# Patient Record
Sex: Female | Born: 1985 | Hispanic: Yes | Marital: Married | Smoking: Former smoker
Health system: Southern US, Community
[De-identification: ages and names within clinical notes are randomized; demographics above are authoritative.]

## PROBLEM LIST (undated history)

## (undated) DIAGNOSIS — E739 Lactose intolerance, unspecified: Secondary | ICD-10-CM

## (undated) DIAGNOSIS — R200 Anesthesia of skin: Secondary | ICD-10-CM

## (undated) DIAGNOSIS — M2550 Pain in unspecified joint: Secondary | ICD-10-CM

## (undated) DIAGNOSIS — M549 Dorsalgia, unspecified: Secondary | ICD-10-CM

## (undated) DIAGNOSIS — G4730 Sleep apnea, unspecified: Secondary | ICD-10-CM

## (undated) DIAGNOSIS — D649 Anemia, unspecified: Secondary | ICD-10-CM

## (undated) DIAGNOSIS — R5382 Chronic fatigue, unspecified: Secondary | ICD-10-CM

## (undated) DIAGNOSIS — K589 Irritable bowel syndrome without diarrhea: Secondary | ICD-10-CM

## (undated) DIAGNOSIS — K219 Gastro-esophageal reflux disease without esophagitis: Secondary | ICD-10-CM

## (undated) DIAGNOSIS — G9332 Myalgic encephalomyelitis/chronic fatigue syndrome: Secondary | ICD-10-CM

## (undated) DIAGNOSIS — G8929 Other chronic pain: Secondary | ICD-10-CM

## (undated) DIAGNOSIS — E559 Vitamin D deficiency, unspecified: Secondary | ICD-10-CM

## (undated) DIAGNOSIS — M5430 Sciatica, unspecified side: Secondary | ICD-10-CM

## (undated) DIAGNOSIS — E538 Deficiency of other specified B group vitamins: Secondary | ICD-10-CM

## (undated) DIAGNOSIS — R079 Chest pain, unspecified: Secondary | ICD-10-CM

## (undated) HISTORY — DX: Sleep apnea, unspecified: G47.30

## (undated) HISTORY — DX: Deficiency of other specified B group vitamins: E53.8

## (undated) HISTORY — DX: Myalgic encephalomyelitis/chronic fatigue syndrome: G93.32

## (undated) HISTORY — DX: Vitamin D deficiency, unspecified: E55.9

## (undated) HISTORY — DX: Other chronic pain: G89.29

## (undated) HISTORY — DX: Sciatica, unspecified side: M54.30

## (undated) HISTORY — DX: Gastro-esophageal reflux disease without esophagitis: K21.9

## (undated) HISTORY — DX: Lactose intolerance, unspecified: E73.9

## (undated) HISTORY — DX: Anemia, unspecified: D64.9

## (undated) HISTORY — DX: Anesthesia of skin: R20.0

## (undated) HISTORY — DX: Chronic fatigue, unspecified: R53.82

## (undated) HISTORY — DX: Chest pain, unspecified: R07.9

## (undated) HISTORY — DX: Dorsalgia, unspecified: M54.9

## (undated) HISTORY — DX: Pain in unspecified joint: M25.50

## (undated) HISTORY — DX: Irritable bowel syndrome without diarrhea: K58.9

## (undated) HISTORY — PX: CHOLECYSTECTOMY: SHX55

---

## 2011-09-05 ENCOUNTER — Emergency Department (HOSPITAL_COMMUNITY)
Admission: EM | Admit: 2011-09-05 | Discharge: 2011-09-05 | Disposition: A | Discharge status: Home / Self Care | Attending: Emergency Medicine | Admitting: Emergency Medicine

## 2011-09-05 ENCOUNTER — Encounter (HOSPITAL_COMMUNITY): Payer: Self-pay | Admitting: Emergency Medicine

## 2011-09-05 DIAGNOSIS — R109 Unspecified abdominal pain: Secondary | ICD-10-CM | POA: Insufficient documentation

## 2011-09-05 DIAGNOSIS — K2970 Gastritis, unspecified, without bleeding: Secondary | ICD-10-CM | POA: Insufficient documentation

## 2011-09-05 DIAGNOSIS — K2990 Gastroduodenitis, unspecified, without bleeding: Secondary | ICD-10-CM | POA: Insufficient documentation

## 2011-09-05 LAB — DIFFERENTIAL
Basophils Absolute: 0 10*3/uL (ref 0.0–0.1)
Basophils Relative: 1 % (ref 0–1)
Eosinophils Absolute: 0.2 K/uL (ref 0.0–0.7)
Eosinophils Relative: 2 % (ref 0–5)
Lymphocytes Relative: 36 % (ref 12–46)
Lymphs Abs: 2.9 10*3/uL (ref 0.7–4.0)
Monocytes Absolute: 0.4 10*3/uL (ref 0.1–1.0)
Monocytes Relative: 5 % (ref 3–12)
Neutro Abs: 4.6 K/uL (ref 1.7–7.7)
Neutrophils Relative %: 56 % (ref 43–77)

## 2011-09-05 LAB — BASIC METABOLIC PANEL
BUN: 14 mg/dL (ref 6–23)
CO2: 24 mEq/L (ref 19–32)
Calcium: 8.9 mg/dL (ref 8.4–10.5)
Creatinine, Ser: 0.58 mg/dL (ref 0.50–1.10)
Glucose, Bld: 105 mg/dL — ABNORMAL HIGH (ref 70–99)

## 2011-09-05 LAB — BASIC METABOLIC PANEL WITH GFR
Chloride: 104 meq/L (ref 96–112)
GFR calc Af Amer: >90 mL/min (ref >90)
GFR calc non Af Amer: >90 mL/min (ref >90)
Potassium: 3.8 meq/L (ref 3.5–5.1)
Sodium: 138 meq/L (ref 135–145)

## 2011-09-05 LAB — CBC
HCT: 41.1 % (ref 36.0–46.0)
Hemoglobin: 14.2 g/dL (ref 12.0–15.0)
MCH: 29.3 pg (ref 26.0–34.0)
MCHC: 34.5 g/dL (ref 30.0–36.0)
MCV: 84.7 fL (ref 78.0–100.0)
Platelets: 239 K/uL (ref 150–400)
RBC: 4.85 MIL/uL (ref 3.87–5.11)
RDW: 12.4 % (ref 11.5–15.5)
WBC: 8.1 10*3/uL (ref 4.0–10.5)

## 2011-09-05 LAB — URINE MICROSCOPIC-ADD ON

## 2011-09-05 LAB — URINALYSIS, ROUTINE W REFLEX MICROSCOPIC
Bilirubin Urine: NEGATIVE
Glucose, UA: NEGATIVE mg/dL
Hgb urine dipstick: NEGATIVE
Ketones, ur: NEGATIVE mg/dL
Nitrite: NEGATIVE
Protein, ur: NEGATIVE mg/dL
Specific Gravity, Urine: 1.015 (ref 1.005–1.030)
Urobilinogen, UA: 0.2 mg/dL (ref 0.0–1.0)
pH: 6 (ref 5.0–8.0)

## 2011-09-05 LAB — POCT PREGNANCY, URINE: Preg Test, Ur: NEGATIVE

## 2011-09-05 MED ORDER — HYDROCODONE-ACETAMINOPHEN 5-325 MG PO TABS: 1.0000 | ORAL_TABLET | ORAL | Status: AC | PRN

## 2011-09-05 MED ORDER — ONDANSETRON 4 MG PO TBDP
8.0000 mg | ORAL_TABLET | Freq: Once | ORAL | Status: AC
  Administered 2011-09-05: 8 mg via ORAL
  Filled 2011-09-05: qty 2

## 2011-09-05 MED ORDER — FAMOTIDINE 20 MG PO TABS
20.0000 mg | ORAL_TABLET | Freq: Once | ORAL | Status: AC
  Administered 2011-09-05: 20 mg via ORAL
  Filled 2011-09-05: qty 1

## 2011-09-05 MED ORDER — GI COCKTAIL ~~LOC~~
30.0000 mL | Freq: Once | ORAL | Status: AC
  Administered 2011-09-05: 30 mL via ORAL
  Filled 2011-09-05: qty 30

## 2011-09-05 MED ORDER — ONDANSETRON HCL 8 MG PO TABS: 8.0000 mg | ORAL_TABLET | Freq: Three times a day (TID) | ORAL | Status: AC | PRN

## 2011-09-05 MED ORDER — FAMOTIDINE 10 MG PO TABS: 10.0000 mg | ORAL_TABLET | Freq: Two times a day (BID) | ORAL | Status: DC

## 2011-09-05 MED ORDER — HYDROCODONE-ACETAMINOPHEN 5-325 MG PO TABS
2.0000 | ORAL_TABLET | Freq: Once | ORAL | Status: AC
  Administered 2011-09-05: 2 via ORAL
  Filled 2011-09-05: qty 2

## 2011-09-05 NOTE — Discharge Instructions (Signed)
Take pepcid as prescribed.  You can try tums/rolaids for break-through pain or vicodin if pain is severe.  Do not drive within four hours of taking this medication (may cause drowsiness or confusion).  Avoid alcohol, caffeine and anti-inflammatory medications such as ibuprofen, motrin, advil, aleve and aspirin.  You should return to the ER if you develop fever or worsening pain.    Gastritis Gastritis is an inflammation (the body's way of reacting to injury and/or infection) of the stomach. It is often caused by viral or bacterial (germ) infections. It can also be caused by chemicals (including alcohol) and medications. This illness may be associated with generalized malaise (feeling tired, not well), cramps, and fever. The illness may last 2 to 7 days. If symptoms of gastritis continue, gastroscopy (looking into the stomach with a telescope-like instrument), biopsy (taking tissue samples), and/or blood tests may be necessary to determine the cause. Antibiotics will not affect the illness unless there is a bacterial infection present. One common bacterial cause of gastritis is an organism known as H. Pylori. This can be treated with antibiotics. Other forms of gastritis are caused by too much acid in the stomach. They can be treated with medications such as H2 blockers and antacids. Home treatment is usually all that is needed. Young children will quickly become dehydrated (loss of body fluids) if vomiting and diarrhea are both present. Medications may be given to control nausea. Medications are usually not given for diarrhea unless especially bothersome. Some medications slow the removal of the virus from the gastrointestinal tract. This slows down the healing process. HOME CARE INSTRUCTIONS Home care instructions for nausea and vomiting:  For adults: drink small amounts of fluids often. Drink at least 2 quarts a day. Take sips frequently. Do not drink large amounts of fluid at one time. This may worsen the  nausea.   Only take over-the-counter or prescription medicines for pain, discomfort, or fever as directed by your caregiver.   Drink clear liquids only. Those are anything you can see through such as water, broth, or soft drinks.   Once you are keeping clear liquids down, you may start full liquids, soups, juices, and ice cream or sherbet. Slowly add bland (plain, not spicy) foods to your diet.  Home care instructions for diarrhea:  Diarrhea can be caused by bacterial infections or a virus. Your condition should improve with time, rest, fluids, and/or anti-diarrheal medication.   Until your diarrhea is under control, you should drink clear liquids often in small amounts. Clear liquids include: water, broth, jell-o water and weak tea.  Avoid:  Milk.   Fruits.   Tobacco.   Alcohol.   Extremely hot or cold fluids.   Too much intake of anything at one time.  When your diarrhea stops you may add the following foods, which help the stool to become more formed:  Rice.   Bananas.   Apples without skin.   Dry toast.  Once these foods are tolerated you may add low-fat yogurt and low-fat cottage cheese. They will help to restore the normal bacterial balance in your bowel. Wash your hands well to avoid spreading bacteria (germ) or virus. SEEK IMMEDIATE MEDICAL CARE IF:   You are unable to keep fluids down.   Vomiting or diarrhea become persistent (constant).   Abdominal pain develops, increases, or localizes. (Right sided pain can be appendicitis. Left sided pain in adults can be diverticulitis.)   You develop a fever (an oral temperature above 102 F (38.9 C)).  Diarrhea becomes excessive or contains blood or mucus.   You have excessive weakness, dizziness, fainting or extreme thirst.   You are not improving or you are getting worse.   You have any other questions or concerns.  Document Released: 05/13/2001 Document Revised: 05/08/2011 Document Reviewed:  05/19/2005 [facility] Patient Information 2012 [place], [place].

## 2011-09-05 NOTE — ED Notes (Signed)
PT. REPORTS UPPER ABDOMINAL PAIN WITH NAUSEA ONSET YESTERDAY , ALSO REPORTS LOW BACK PAIN .

## 2011-09-05 NOTE — ED Notes (Signed)
MD at bedside. 

## 2011-09-05 NOTE — ED Provider Notes (Signed)
History     CSN: [id number]  Arrival date & time 09/05/11  0600   First MD Initiated Contact with Patient 09/05/11 [phone]      Chief Complaint  Patient presents with  . Abdominal Pain    (Consider location/radiation/quality/duration/timing/severity/associated sxs/prior treatment) HPI History provided by pt.   Pt has had nearly constant, diffuse, upper abdominal pain for the past 2 days.  Aggravated by laying on her stomach and palpation.  Not affected by po intake and no relief w/ 800mg  ibuprofen.  Occasionally radiates into the chest.  Associated w/ nausea.  Denies fever, cough, vomiting, diarrhea and GU sx.  Past abd surgeries include cholecystectomy.  Pt does not drink alcohol nor does she drink alcohol on a regular basis.    History reviewed. No pertinent past medical history.  History reviewed. No pertinent past surgical history.  No family history on file.  History  Substance Use Topics  . Smoking status: Never Smoker   . Smokeless tobacco: Not on file  . Alcohol Use: No    OB History    Grav Para Term Preterm Abortions TAB SAB Ect Mult Living                  Review of Systems  All other systems reviewed and are negative.    Allergies  Review of patient's allergies indicates not on file.  Home Medications  No current outpatient prescriptions on file.  BP 123/67  Pulse 79  Temp(Src) 98.5 F (36.9 C) (Oral)  Resp 14  SpO2 97%  LMP 08/14/2011  Physical Exam  Nursing note and vitals reviewed. Constitutional: She is oriented to person, place, and time. She appears well-developed and well-nourished. No distress.  HENT:  Head: Normocephalic and atraumatic.  Eyes:       Normal appearance  Neck: Normal range of motion.  Cardiovascular: Normal rate and regular rhythm.   Pulmonary/Chest: Effort normal and breath sounds normal.       Mid-line tenderness over sternum  Abdominal: Soft. Bowel sounds are normal. She exhibits no distension and no mass. There is  no rebound and no guarding.       Mild tenderness across upper abdomen.  Pt reports that it is worst in LUQ.  No CVA tenderness  Musculoskeletal:       Mild tenderness mid-line approx L2-L5.  No saddle anesthesia.  Distal sensation intact and pulses intact.  2+ patellar reflexes.  5/5 LE strength anf full active ROM w/out aggravation of back pain.   Neurological: She is alert and oriented to person, place, and time.  Skin: Skin is warm and dry. No rash noted.  Psychiatric: She has a normal mood and affect. Her behavior is normal.    ED Course  Procedures (including critical care time)  Labs Reviewed  URINALYSIS, ROUTINE W REFLEX MICROSCOPIC - Abnormal; Notable for the following:    Leukocytes, UA SMALL (*)    All other components within normal limits  BASIC METABOLIC PANEL - Abnormal; Notable for the following:    Glucose, Bld 105 (*)    All other components within normal limits  URINE MICROSCOPIC-ADD ON - Abnormal; Notable for the following:    Squamous Epithelial / LPF FEW (*)    Bacteria, UA FEW (*)    All other components within normal limits  CBC  DIFFERENTIAL  LIPASE, BLOOD  POCT PREGNANCY, URINE   No results found.   1. Gastritis       MDM  Healthy 25yo F  w/ h/o cholecystectomy presents w/ c/o diffuse upper abd pain x 2 days.  On exam, afebrile, NAD, mild, diffuse upper abd tenderness (worst in LUQ).  Labs pending.  Suspect gastritis.  Pt receiving GI cocktail, pepcid and zofran.  Will reassess shortly.  7:18 AM   Labs unremarkable.  Pt reports that the pain in her upper abdomen has improved but she is now experiencing mild discomfort in her lower abdomen.  On re-examination, tenderness has improved.  Will try 2po vicodin.  7:53 AM   Pain improved.  Will d/c pt home w/ pepcid, zofran and hydrocodone.  Discussed avoidance of spicy food, caffeine, alcohol and NSAIDs and return precautions.  Referred to [facility].  8:56 AM        Otilio Miu,  PA 09/05/11 [phone]

## 2011-09-05 NOTE — ED Provider Notes (Signed)
Medical screening examination/treatment/procedure(s) were performed by non-physician practitioner and as supervising physician I was immediately available for consultation/collaboration.   Gavin Pound. Judge Duque, MD 09/05/11 [id number]

## 2011-10-23 ENCOUNTER — Emergency Department (HOSPITAL_COMMUNITY)
Admission: EM | Admit: 2011-10-23 | Discharge: 2011-10-24 | Discharge status: Against Medical Advice | Attending: Emergency Medicine | Admitting: Emergency Medicine

## 2011-10-23 DIAGNOSIS — W57XXXA Bitten or stung by nonvenomous insect and other nonvenomous arthropods, initial encounter: Secondary | ICD-10-CM | POA: Insufficient documentation

## 2011-10-23 DIAGNOSIS — T148 Other injury of unspecified body region: Secondary | ICD-10-CM | POA: Insufficient documentation

## 2011-10-24 ENCOUNTER — Encounter (HOSPITAL_COMMUNITY): Payer: Self-pay | Admitting: *Deleted

## 2011-10-24 NOTE — ED Notes (Signed)
Pt was bitten by a spider on her left anterior wrist in 2 spots.  Pt has 2 red marks.  She said it feels like a cramp in her wrist and it was really hot but she said the heat feeling went away.

## 2011-10-24 NOTE — ED Notes (Signed)
Called for room assignment with no response 

## 2011-10-24 NOTE — ED Notes (Signed)
Unable to find pt; called x 3.

## 2012-12-24 ENCOUNTER — Encounter (HOSPITAL_COMMUNITY): Payer: Self-pay | Admitting: *Deleted

## 2012-12-24 ENCOUNTER — Emergency Department (HOSPITAL_COMMUNITY)
Admission: EM | Admit: 2012-12-24 | Discharge: 2012-12-24 | Disposition: A | Discharge status: Home / Self Care | Payer: BC Managed Care – PPO | Attending: Emergency Medicine | Admitting: Emergency Medicine

## 2012-12-24 DIAGNOSIS — M940 Chondrocostal junction syndrome [Tietze]: Secondary | ICD-10-CM | POA: Insufficient documentation

## 2012-12-24 DIAGNOSIS — R5381 Other malaise: Secondary | ICD-10-CM | POA: Insufficient documentation

## 2012-12-24 DIAGNOSIS — R109 Unspecified abdominal pain: Secondary | ICD-10-CM | POA: Insufficient documentation

## 2012-12-24 MED ORDER — LANSOPRAZOLE 30 MG PO CPDR: 30.0000 mg | DELAYED_RELEASE_CAPSULE | Freq: Every day | ORAL | Status: DC

## 2012-12-24 MED ORDER — IBUPROFEN 200 MG PO TABS: 400.0000 mg | ORAL_TABLET | Freq: Three times a day (TID) | ORAL | Status: DC

## 2012-12-24 NOTE — ED Notes (Signed)
Reports left side chest pains for extended amount of time but it has gotten worse x 3 days. Pain is sharp, increases with movement and breathing, denies cough. ekg done at triage.

## 2012-12-24 NOTE — ED Provider Notes (Signed)
CSN: [id number]     Arrival date & time 12/24/12  7253 History     First MD Initiated Contact with Patient 12/24/12 0740     Chief Complaint  Patient presents with  . Chest Pain   (Consider location/radiation/quality/duration/timing/severity/associated sxs/prior Treatment) HPI Comments: Ms. Gruenewald is a 27 yo female with a PMH of gastritis presenting with a 3 month history of left-sided CP.  She initially felt burning that she attributed to heartburn so she tried Tums with no relief.  She has started working out recently and has noticed the pain occurs when she exercises.  She reports stabbing, non-radiating pain with exertion that is 10/10 at its worst.  She denies SOB/N/V, leg swelling or diaphoresis.  She does not currently smoke and she denies OCP use.   History reviewed. No pertinent past medical history. Past Surgical History  Procedure Laterality Date  . Cholecystectomy     History reviewed. No pertinent family history. History  Substance Use Topics  . Smoking status: Never Smoker   . Smokeless tobacco: Not on file  . Alcohol Use: No   OB History   Grav Para Term Preterm Abortions TAB SAB Ect Mult Living                 Review of Systems  Constitutional: Positive for fatigue. Negative for fever, diaphoresis and unexpected weight change.  Respiratory: Negative for shortness of breath.   Cardiovascular: Positive for chest pain. Negative for leg swelling.  Gastrointestinal: Positive for abdominal pain. Negative for nausea, vomiting, diarrhea and constipation.  Genitourinary: Negative for dysuria and difficulty urinating.    Allergies  Shellfish allergy  Home Medications   Current Outpatient Rx  Name  Route  Sig  Dispense  Refill  . EXPIRED: famotidine (PEPCID AC) 10 MG tablet   Oral   Take 1 tablet (10 mg total) by mouth 2 (two) times daily.   12 tablet   0    BP 112/62  Temp(Src) 98.7 F (37.1 C) (Oral)  Resp 20  SpO2 94%  LMP 12/04/2012 Physical Exam   Constitutional: She is oriented to person, place, and time. She appears well-developed and well-nourished. No distress.  HENT:  Head: Normocephalic and atraumatic.  Mouth/Throat: Oropharynx is clear and moist.  Eyes: EOM are normal. Pupils are equal, round, and reactive to light.  Neck: Neck supple.  Cardiovascular: Normal rate, regular rhythm and normal heart sounds.   Pulmonary/Chest: Effort normal and breath sounds normal. She exhibits tenderness.  L chest TTP and with movement of her arm.  Abdominal: Soft. Bowel sounds are normal. She exhibits no distension. There is no tenderness. There is no guarding.  Musculoskeletal: Normal range of motion. She exhibits no edema and no tenderness.  Neurological: She is alert and oriented to person, place, and time.  Skin: She is not diaphoretic.  Psychiatric: She has a normal mood and affect. Her behavior is normal.    ED Course   Procedures (including critical care time)  Labs Reviewed - No data to display No results found. No diagnosis found.   Date: 12/24/2012  Rate: 67 bpm  Rhythm: normal sinus rhythm  QRS Axis: normal  Intervals: normal  ST/T Wave abnormalities: normal  Conduction Disutrbances:none  Narrative Interpretation:   Old EKG Reviewed: none available   MDM  27 yo female with reproducible CP on exam and normal EKG.   ACS unlikely given young age, lack of risk factors, reproducible CP and normal EKG.  PE less likely given  lack of symptoms and pt hemodynamically stable.    Pt with costochondritis and informed that she should take ibuprofen 400mg  TID for 1 week.  She was also informed she should take Prevacid daily to protect her stomach.  Rx provided for ibuprofen and Prevacid and pt discharged in stable condition with instruction to return to the ED if symptoms worsen or new symptoms develop.  Evelena Peat, DO 12/24/12 1037

## 2012-12-24 NOTE — ED Notes (Signed)
Pt alert and mentating appropriately upon d/c. Pt given d/c teaching, prescriptions and follow up care instructions. Pt verbalizes understanding of d/c teaching and has no further questions upon d/c. Pt ambulatory upon d/c. NAD noted upon d/c. Pt leaving with family.

## 2012-12-24 NOTE — ED Notes (Signed)
Pt alert and mentating appropriately. Pt states sciatica nerve damage with some tingling in her legs intermittently. Pt states she was told to go to physical therapy. Pt states sometimes she becomes shortness of breath with exercise. Pt states chest pain increases with exercise. Pt states she thought this was maybe heart burn. Pt denies n/v/d currently. Pt denies dizziness and lightheadedness. Pt denies fever and chills. Pt speaking in clear complete sentences.

## 2012-12-25 NOTE — ED Provider Notes (Signed)
I saw and evaluated the patient, reviewed the resident's note and I agree with the findings and plan. Chest wall pain and gastritis. Chest reproduced with palpation. States she has been taking 800 mg of ibuprofen for chest pain and is now having epigastric pain with vomiting.   Loren Racer, MD 12/25/12 1121

## 2013-03-03 ENCOUNTER — Encounter: Payer: Self-pay | Admitting: Interventional Cardiology

## 2013-03-03 ENCOUNTER — Ambulatory Visit (INDEPENDENT_AMBULATORY_CARE_PROVIDER_SITE_OTHER): Payer: BC Managed Care – PPO | Admitting: Interventional Cardiology

## 2013-03-03 ENCOUNTER — Ambulatory Visit (HOSPITAL_COMMUNITY): Payer: BC Managed Care – PPO | Attending: Cardiology | Admitting: Radiology

## 2013-03-03 VITALS — BP 110/74 | HR 58 | Ht 63.0 in | Wt 181.0 lb

## 2013-03-03 DIAGNOSIS — R079 Chest pain, unspecified: Secondary | ICD-10-CM

## 2013-03-03 DIAGNOSIS — Z87891 Personal history of nicotine dependence: Secondary | ICD-10-CM | POA: Insufficient documentation

## 2013-03-03 DIAGNOSIS — R072 Precordial pain: Secondary | ICD-10-CM | POA: Insufficient documentation

## 2013-03-03 DIAGNOSIS — Z8249 Family history of ischemic heart disease and other diseases of the circulatory system: Secondary | ICD-10-CM | POA: Insufficient documentation

## 2013-03-03 DIAGNOSIS — G8929 Other chronic pain: Secondary | ICD-10-CM | POA: Insufficient documentation

## 2013-03-03 DIAGNOSIS — M549 Dorsalgia, unspecified: Secondary | ICD-10-CM | POA: Insufficient documentation

## 2013-03-03 NOTE — Patient Instructions (Addendum)
Your physician has requested that you have an echocardiogram. Echocardiography is a painless test that uses sound waves to create images of your heart. It provides your doctor with information about the size and shape of your heart and how well your heart's chambers and valves are working. This procedure takes approximately one hour. There are no restrictions for this procedure.  Your physician has requested that you have an exercise tolerance test. For further information please visit www.[email or web address]. Please also follow instruction sheet, as given.  Follow up as needed  

## 2013-03-03 NOTE — Progress Notes (Signed)
Echocardiogram performed.  

## 2013-03-03 NOTE — Progress Notes (Signed)
Patient ID: Deborah Nichols, female   DOB: 12-Oct-1985, 27 y.o.   MRN: [id number]     Patient ID: Deborah Nichols MRN: [id number] DOB/AGE: Oct 16, 1985 26 y.o.  Admit date: (Not on file) Referring Physician [facility] ER. / Dr. Reola Calkins   Reason for Consultation:chest pain  HPI: 27 y/o woman who was diagnosed with sciatica.  She has had persistent back pain.  She is unable to stand or walk for long periods of time. Back pain has been going on for 2 years.  She is using heating pads.   Over the past month, she has had some chest discomfort in addition to the back pain.  She thinks it is related to weight. She wants to take diet pills.  SHe is very anxious because she has so many obligations during the day.  She runs a business.  The pain in her chest is a pinching sensation that lasts a whole day.  She went to the ER after prolonged pain and had a negative workup.  She has felt her heart swollen.  She is interested in the diet pill Bethel 30, a natural supplement for weight loss.      Past Medical History  Diagnosis Date  . Chronic back pain     Family History  Problem Relation Age of Onset  . Hypertension Father   . Diabetes Father   . Coronary artery disease Sister     History   Social History  . Marital Status: Married    Spouse Name: N/A    Number of Children: N/A  . Years of Education: N/A   Occupational History  . Not on file.   Social History Main Topics  . Smoking status: Former Smoker    Types: Cigarettes  . Smokeless tobacco: Not on file  . Alcohol Use: No  . Drug Use: No  . Sexual Activity: Yes    Birth Control/ Protection: None   Other Topics Concern  . Not on file   Social History Narrative  . No narrative on file    Past Surgical History  Procedure Laterality Date  . Cholecystectomy        (Not in a hospital admission)  Review of systems complete and found to be negative unless listed above .  No nausea, vomiting.  No fever chills, No  focal weakness,  No palpitations.  Physical Exam: @VRANGES @  Weight: 181 lb (82.101 kg)  110/74  58 Physical exam: Anxious, tearful at times [place]/AT EOMI No JVD, No carotid bruit RRR S1S2  No wheezing Soft. NT, nondistended No edema. No focal motor or sensory deficits Normal affect  Labs:   Lab Results  Component Value Date   WBC 8.1 09/05/2011   HGB 14.2 09/05/2011   HCT 41.1 09/05/2011   MCV 84.7 09/05/2011   PLT 239 09/05/2011   No results found for this basename: NA, K, CL, CO2, BUN, CREATININE, CALCIUM, LABALBU, PROT, BILITOT, ALKPHOS, ALT, AST, GLUCOSE,  in the last 168 hours No results found for this basename: CKTOTAL,  CKMB,  CKMBINDEX,  TROPONINI    No results found for this basename: CHOL   No results found for this basename: HDL   No results found for this basename: LDLCALC   No results found for this basename: TRIG   No results found for this basename: CHOLHDL   No results found for this basename: LDLDIRECT      Radiology VFI:EPPIRJ  ASSESSMENT AND PLAN:   atypical chest pain: Back pain as  her primary issue. She is very anxious about her heart.  Will plan for echocardiogram to evaluate for structural heart disease. We'll plan for exercise treadmill test to evaluate for ischemia. I think this will put her mind at ease. She is interested in taking diet pills. Will check with our pharmacist regarding safety of the natural diet pill that she is considering.   Signed:   Fredric Mare, MD, [facility] 03/03/2013, 1:36 PM

## 2013-03-04 ENCOUNTER — Telehealth: Payer: Self-pay | Admitting: Cardiology

## 2013-03-04 NOTE — Telephone Encounter (Signed)
Message copied by Theda Sers on Fri Mar 04, 2013  1:41 PM ------      Message from: Corky Crafts      Created: Fri Mar 04, 2013  1:39 PM      Regarding: FW: Bethel 30       Would hold off on the supplement until we see what her stress test shows.  Echo was normal      ----- Message -----         From: Evie Lacks, [facility]         Sent: 03/03/2013   3:56 PM           To: Everette Rank, MD      Subject: RE: Harrell Gave 30                                            So the product is Bethel 44- Slim K with free senna 1300.  The main ingredient is [employer]- which works as a stimulant similar to epinephrine.  May not be best option until we see what her stress test shows.                   ----- Message -----         From: Everette Rank, MD         Sent: 03/03/2013  12:12 PM           To: Evie Lacks, RPH      Subject: Bethel 30                                                Do you know anything about the side effects of Bethel 30 , a natural weight loss supplement.  THanks.            JV             ------

## 2013-03-24 ENCOUNTER — Encounter: Payer: Self-pay | Admitting: Interventional Cardiology

## 2013-03-24 ENCOUNTER — Encounter: Payer: Self-pay | Admitting: *Deleted

## 2013-03-24 DIAGNOSIS — R079 Chest pain, unspecified: Secondary | ICD-10-CM | POA: Insufficient documentation

## 2013-03-24 DIAGNOSIS — M5430 Sciatica, unspecified side: Secondary | ICD-10-CM | POA: Insufficient documentation

## 2013-03-25 ENCOUNTER — Encounter: Payer: BC Managed Care – PPO | Admitting: Interventional Cardiology

## 2013-06-06 ENCOUNTER — Encounter (HOSPITAL_COMMUNITY): Payer: Self-pay | Admitting: Emergency Medicine

## 2013-06-06 ENCOUNTER — Emergency Department (HOSPITAL_COMMUNITY)
Admission: EM | Admit: 2013-06-06 | Discharge: 2013-06-06 | Discharge status: Against Medical Advice | Attending: Emergency Medicine | Admitting: Emergency Medicine

## 2013-06-06 DIAGNOSIS — Z87891 Personal history of nicotine dependence: Secondary | ICD-10-CM | POA: Insufficient documentation

## 2013-06-06 DIAGNOSIS — R0602 Shortness of breath: Secondary | ICD-10-CM | POA: Insufficient documentation

## 2013-06-06 DIAGNOSIS — R110 Nausea: Secondary | ICD-10-CM | POA: Insufficient documentation

## 2013-06-06 DIAGNOSIS — R079 Chest pain, unspecified: Secondary | ICD-10-CM | POA: Insufficient documentation

## 2013-06-06 DIAGNOSIS — G8929 Other chronic pain: Secondary | ICD-10-CM | POA: Insufficient documentation

## 2013-06-06 DIAGNOSIS — M549 Dorsalgia, unspecified: Secondary | ICD-10-CM | POA: Insufficient documentation

## 2013-06-06 DIAGNOSIS — R209 Unspecified disturbances of skin sensation: Secondary | ICD-10-CM | POA: Insufficient documentation

## 2013-06-06 LAB — CBC
HEMATOCRIT: 40.5 % (ref 36.0–46.0)
Hemoglobin: 14.1 g/dL (ref 12.0–15.0)
MCH: 29.9 pg (ref 26.0–34.0)
MCHC: 34.8 g/dL (ref 30.0–36.0)
MCV: 86 fL (ref 78.0–100.0)
Platelets: 264 10*3/uL (ref 150–400)
RBC: 4.71 MIL/uL (ref 3.87–5.11)
RDW: 12 % (ref 11.5–15.5)
WBC: 8.4 10*3/uL (ref 4.0–10.5)

## 2013-06-06 LAB — POCT I-STAT TROPONIN I: Troponin i, poc: 0.01 ng/mL (ref 0.00–0.08)

## 2013-06-06 LAB — BASIC METABOLIC PANEL
BUN: 12 mg/dL (ref 6–23)
CHLORIDE: 103 meq/L (ref 96–112)
CO2: 26 meq/L (ref 19–32)
CREATININE: 0.57 mg/dL (ref 0.50–1.10)
Calcium: 9.2 mg/dL (ref 8.4–10.5)
GFR calc Af Amer: >90 mL/min (ref >90)
GFR calc non Af Amer: >90 mL/min (ref >90)
Glucose, Bld: 105 mg/dL — ABNORMAL HIGH (ref 70–99)
POTASSIUM: 3.7 meq/L (ref 3.7–5.3)
Sodium: 141 mEq/L (ref 137–147)

## 2013-06-06 LAB — PRO B NATRIURETIC PEPTIDE: Pro B Natriuretic peptide (BNP): 39 pg/mL (ref 0–125)

## 2013-06-06 NOTE — ED Notes (Addendum)
Pt. reports intermittent left chest " pinching " sensation onset this evening with slight SOB , bilateral arm numbness and nausea. Denies diaphoresis .

## 2013-11-29 ENCOUNTER — Other Ambulatory Visit: Payer: Self-pay | Admitting: Nurse Practitioner

## 2013-11-29 DIAGNOSIS — R1032 Left lower quadrant pain: Secondary | ICD-10-CM

## 2013-12-07 ENCOUNTER — Other Ambulatory Visit

## 2013-12-08 ENCOUNTER — Ambulatory Visit
Admission: RE | Admit: 2013-12-08 | Discharge: 2013-12-08 | Disposition: A | Discharge status: Home / Self Care | Source: Ambulatory Visit | Attending: Nurse Practitioner | Admitting: Nurse Practitioner

## 2013-12-08 ENCOUNTER — Other Ambulatory Visit: Payer: Self-pay | Admitting: Nurse Practitioner

## 2013-12-08 ENCOUNTER — Other Ambulatory Visit

## 2013-12-08 DIAGNOSIS — R1032 Left lower quadrant pain: Secondary | ICD-10-CM

## 2013-12-08 DIAGNOSIS — D259 Leiomyoma of uterus, unspecified: Secondary | ICD-10-CM

## 2014-04-12 ENCOUNTER — Other Ambulatory Visit: Payer: Self-pay | Admitting: Sports Medicine

## 2014-04-12 DIAGNOSIS — M5416 Radiculopathy, lumbar region: Secondary | ICD-10-CM

## 2014-04-20 ENCOUNTER — Ambulatory Visit
Admission: RE | Admit: 2014-04-20 | Discharge: 2014-04-20 | Disposition: A | Discharge status: Home / Self Care | Source: Ambulatory Visit | Attending: Sports Medicine | Admitting: Sports Medicine

## 2014-04-20 DIAGNOSIS — M5416 Radiculopathy, lumbar region: Secondary | ICD-10-CM

## 2015-02-23 ENCOUNTER — Emergency Department (HOSPITAL_COMMUNITY)
Admission: EM | Admit: 2015-02-23 | Discharge: 2015-02-23 | Disposition: A | Discharge status: Home / Self Care | Attending: Emergency Medicine | Admitting: Emergency Medicine

## 2015-02-23 ENCOUNTER — Encounter (HOSPITAL_COMMUNITY): Payer: Self-pay | Admitting: Cardiology

## 2015-02-23 ENCOUNTER — Emergency Department (HOSPITAL_COMMUNITY)

## 2015-02-23 DIAGNOSIS — R0789 Other chest pain: Secondary | ICD-10-CM

## 2015-02-23 DIAGNOSIS — R079 Chest pain, unspecified: Secondary | ICD-10-CM | POA: Diagnosis present

## 2015-02-23 DIAGNOSIS — G8929 Other chronic pain: Secondary | ICD-10-CM | POA: Insufficient documentation

## 2015-02-23 DIAGNOSIS — Z87891 Personal history of nicotine dependence: Secondary | ICD-10-CM | POA: Diagnosis not present

## 2015-02-23 LAB — CBC
HCT: 42 % (ref 36.0–46.0)
Hemoglobin: 14.4 g/dL (ref 12.0–15.0)
MCH: 29.4 pg (ref 26.0–34.0)
MCHC: 34.3 g/dL (ref 30.0–36.0)
MCV: 85.9 fL (ref 78.0–100.0)
Platelets: 230 10*3/uL (ref 150–400)
RBC: 4.89 MIL/uL (ref 3.87–5.11)
RDW: 12.3 % (ref 11.5–15.5)
WBC: 7.8 10*3/uL (ref 4.0–10.5)

## 2015-02-23 LAB — BASIC METABOLIC PANEL
Anion gap: 6 (ref 5–15)
BUN: 6 mg/dL (ref 6–20)
CALCIUM: 9.2 mg/dL (ref 8.9–10.3)
CHLORIDE: 107 mmol/L (ref 101–111)
CO2: 26 mmol/L (ref 22–32)
CREATININE: 0.54 mg/dL (ref 0.44–1.00)
GFR calc Af Amer: >60 mL/min (ref >60)
GFR calc non Af Amer: >60 mL/min (ref >60)
GLUCOSE: 103 mg/dL — AB (ref 65–99)
Potassium: 3.8 mmol/L (ref 3.5–5.1)
Sodium: 139 mmol/L (ref 135–145)

## 2015-02-23 LAB — I-STAT TROPONIN, ED: TROPONIN I, POC: 0 ng/mL (ref 0.00–0.08)

## 2015-02-23 NOTE — Discharge Instructions (Signed)
Chest Pain (Nonspecific) It is often hard to give a diagnosis for the cause of chest pain. There is always a chance that your pain could be related to something serious, such as a heart attack or a blood clot in the lungs. You need to follow up with your doctor. HOME CARE  If antibiotic medicine was given, take it as directed by your doctor. Finish the medicine even if you start to feel better.  For the next few days, avoid activities that bring on chest pain. Continue physical activities as told by your doctor.  Do not use any tobacco products. This includes cigarettes, chewing tobacco, and e-cigarettes.  Avoid drinking alcohol.  Only take medicine as told by your doctor.  Follow your doctor's suggestions for more testing if your chest pain does not go away.  Keep all doctor visits you made. GET HELP IF:  Your chest pain does not go away, even after treatment.  You have a rash with blisters on your chest.  You have a fever. GET HELP RIGHT AWAY IF:   You have more pain or pain that spreads to your arm, neck, jaw, back, or belly (abdomen).  You have shortness of breath.  You cough more than usual or cough up blood.  You have very bad back or belly pain.  You feel sick to your stomach (nauseous) or throw up (vomit).  You have very bad weakness.  You pass out (faint).  You have chills. This is an emergency. Do not wait to see if the problems will go away. Call your local emergency services (911 in [place] Do not drive yourself to the hospital. MAKE SURE YOU:   Understand these instructions.  Will watch your condition.  Will get help right away if you are not doing well or get worse. Document Released: 11/05/2007 Document Revised: 05/24/2013 Document Reviewed: 11/05/2007 [facility] Patient Information 2015 [place], [place]. This information is not intended to replace advice given to you by your health care provider. Make sure you discuss any questions you have with your  health care provider.  Follow up with cardiology. Recommend stress test for evaluation of chest pain. Return to the ED if your symptoms worsen or if you experience syncope, blurry vision, vomiting, shortness of breath.

## 2015-02-23 NOTE — ED Notes (Signed)
Pt reports chest pain and SOB for the past couple of weeks. Reports acid reflux also.

## 2015-02-23 NOTE — ED Notes (Signed)
Pt able to dress independently and ambulate in hall.  Gait steady and even.

## 2015-02-23 NOTE — ED Provider Notes (Signed)
CSN: [id number]     Arrival date & time 02/23/15  1211 History   First MD Initiated Contact with Patient 02/23/15 1425     Chief Complaint  Patient presents with  . Chest Pain     (Consider location/radiation/quality/duration/timing/severity/associated sxs/prior Treatment) HPI Comments: Deborah Nichols is a 29 y.o F with pmhx cholecystectomy 2013 who since the emergency department today complaining of intermittent chest pain for the last several months with newly associated shortness of breath and difficulty sleeping due to the pain. Patient also complaining of irritable bowel for several years since she had her gallbladder removed. Patient states that she has been experiencing left-sided chest pain, does not radiate that feels like a pinch when she moves. Now experiencing mild shortness of breath when she experiences "the pinch". Denies shortness of breath or chest pain at this time. Patient evaluated by cardiology however, did not have the stress test. Patient saw gastroenterology last month for IBS and states that they changed her diet which has helped significantly. Denies vomiting, fever, lightheadedness, dizziness, abdominal pain, loss of consciousness, numbness, weakness.  Patient is a 29 y.o. female presenting with chest pain. The history is provided by the patient.  Chest Pain   Past Medical History  Diagnosis Date  . Chronic back pain   . Sciatica   . Chest pain    Past Surgical History  Procedure Laterality Date  . Cholecystectomy     Family History  Problem Relation Age of Onset  . Hypertension Father   . Diabetes Father   . Coronary artery disease Sister    Social History  Substance Use Topics  . Smoking status: Former Smoker    Types: Cigarettes  . Smokeless tobacco: None  . Alcohol Use: No   OB History    No data available     Review of Systems  Cardiovascular: Positive for chest pain.  All other systems reviewed and are negative.     Allergies   Shellfish allergy  Home Medications   Prior to Admission medications   Not on File   BP 103/72 mmHg  Pulse 84  Temp(Src) 98.8 F (37.1 C) (Oral)  Resp 16  Wt 181 lb (82.101 kg)  SpO2 97%  LMP 02/13/2015 Physical Exam  Constitutional: She is oriented to person, place, and time. She appears well-developed and well-nourished. No distress.  HENT:  Head: Normocephalic and atraumatic.  Mouth/Throat: Oropharynx is clear and moist. No oropharyngeal exudate.  Eyes: Conjunctivae and EOM are normal. Pupils are equal, round, and reactive to light. Right eye exhibits no discharge. Left eye exhibits no discharge. No scleral icterus.  Neck: Normal range of motion. Neck supple. No tracheal deviation present.  Cardiovascular: Normal rate, regular rhythm, normal heart sounds and intact distal pulses.  Exam reveals no gallop and no friction rub.   No murmur heard. Pulmonary/Chest: Effort normal and breath sounds normal. No respiratory distress. She has no wheezes. She has no rales. She exhibits no tenderness.  Abdominal: Soft. Bowel sounds are normal. She exhibits no distension and no mass. There is no tenderness. There is no rebound and no guarding.  Musculoskeletal: Normal range of motion. She exhibits no edema or tenderness.  Lymphadenopathy:    She has no cervical adenopathy.  Neurological: She is alert and oriented to person, place, and time. No cranial nerve deficit.  Strength 5/5 throughout. No sensory deficits.  No gait abnormality.   Skin: Skin is warm and dry. No rash noted. She is not diaphoretic. No erythema.  No pallor.  Psychiatric: She has a normal mood and affect. Her behavior is normal.  Nursing note and vitals reviewed.   ED Course  Procedures (including critical care time) Labs Review Labs Reviewed  BASIC METABOLIC PANEL - Abnormal; Notable for the following:    Glucose, Bld 103 (*)    All other components within normal limits  CBC  I-STAT TROPOININ, ED    Imaging  Review Dg Chest 2 View  02/23/2015   CLINICAL DATA:  Left side CP, pinching sensation and SOB for several days now - experienced similar pain when had gallbladder out in 2009 - no known heart or lung issues, nonsmoker  EXAM: CHEST  2 VIEW  COMPARISON:  None.  FINDINGS: The heart size and mediastinal contours are within normal limits. Both lungs are clear. No pleural effusion or pneumothorax. The visualized skeletal structures are unremarkable.  IMPRESSION: No active cardiopulmonary disease.   Electronically Signed   By: Lajean Manes M.D.   On: 02/23/2015 13:03   I have personally reviewed and evaluated these images and lab results as part of my medical decision-making.   EKG Interpretation   Date/Time:  Friday February 23 2015 12:23:15 EDT Ventricular Rate:  78 PR Interval:  116 QRS Duration: 96 QT Interval:  398 QTC Calculation: 453 R Axis:   -16 Text Interpretation:  Normal sinus rhythm Normal ECG No significant change  since last tracing Confirmed by ALLEN  MD, ANTHONY ([id number]) on 02/23/2015  3:30:13 PM      MDM   Final diagnoses:  Atypical chest pain   patient seen for atypical chest pain times several months as well as IBS-like symptoms for the past several years. EKG within normal limits. Troponin negative. Low risk heart score. Low suspicion of ACS. Patient is PERC negative. No unilateral extremity swelling. Do not expect PE. Attribute IBS-like symptoms to sequelae of cholecystectomy. Recommend follow-up with GI if symptoms worsen. Recommend follow-up with cardiology for stress test. Discussed treatment plan with patient who is agreeable. Return precautions outlined in patient discharge instructions.  .Patient was discussed with Dr. Zenia Resides who agrees with the treatment plan.      Dondra Spry [place], PA-C 02/23/15 1636  Lacretia Leigh, MD 02/24/15 [phone]

## 2015-03-23 ENCOUNTER — Emergency Department
Admission: EM | Admit: 2015-03-23 | Discharge: 2015-03-23 | Disposition: A | Discharge status: Home / Self Care | Attending: Emergency Medicine | Admitting: Emergency Medicine

## 2015-03-23 DIAGNOSIS — R110 Nausea: Secondary | ICD-10-CM

## 2015-03-23 DIAGNOSIS — E162 Hypoglycemia, unspecified: Secondary | ICD-10-CM | POA: Insufficient documentation

## 2015-03-23 DIAGNOSIS — Z3202 Encounter for pregnancy test, result negative: Secondary | ICD-10-CM | POA: Insufficient documentation

## 2015-03-23 DIAGNOSIS — Z87891 Personal history of nicotine dependence: Secondary | ICD-10-CM | POA: Diagnosis not present

## 2015-03-23 DIAGNOSIS — R531 Weakness: Secondary | ICD-10-CM | POA: Diagnosis present

## 2015-03-23 LAB — CBC WITH DIFFERENTIAL/PLATELET
BASOS ABS: 0.1 10*3/uL (ref 0–0.1)
Basophils Relative: 1 %
EOS PCT: 1 %
Eosinophils Absolute: 0.1 10*3/uL (ref 0–0.7)
HEMATOCRIT: 42.9 % (ref 35.0–47.0)
Hemoglobin: 14.7 g/dL (ref 12.0–16.0)
LYMPHS ABS: 2.4 10*3/uL (ref 1.0–3.6)
Lymphocytes Relative: 25 %
MCH: 29.3 pg (ref 26.0–34.0)
MCHC: 34.3 g/dL (ref 32.0–36.0)
MCV: 85.5 fL (ref 80.0–100.0)
MONO ABS: 0.4 10*3/uL (ref 0.2–0.9)
Monocytes Relative: 4 %
NEUTROS ABS: 6.9 10*3/uL — AB (ref 1.4–6.5)
Neutrophils Relative %: 69 %
PLATELETS: 253 10*3/uL (ref 150–440)
RBC: 5.02 MIL/uL (ref 3.80–5.20)
RDW: 12.4 % (ref 11.5–14.5)
WBC: 9.9 10*3/uL (ref 3.6–11.0)

## 2015-03-23 LAB — URINALYSIS COMPLETE WITH MICROSCOPIC (ARMC ONLY)
BACTERIA UA: NONE SEEN
Bilirubin Urine: NEGATIVE
Glucose, UA: NEGATIVE mg/dL
Ketones, ur: NEGATIVE mg/dL
LEUKOCYTES UA: NEGATIVE
Nitrite: NEGATIVE
PH: 6 (ref 5.0–8.0)
Protein, ur: NEGATIVE mg/dL
Specific Gravity, Urine: 1.013 (ref 1.005–1.030)

## 2015-03-23 LAB — BASIC METABOLIC PANEL
ANION GAP: 3 — AB (ref 5–15)
BUN: 12 mg/dL (ref 6–20)
CHLORIDE: 105 mmol/L (ref 101–111)
CO2: 28 mmol/L (ref 22–32)
Calcium: 9 mg/dL (ref 8.9–10.3)
Creatinine, Ser: 0.64 mg/dL (ref 0.44–1.00)
GFR calc Af Amer: >60 mL/min (ref >60)
GLUCOSE: 107 mg/dL — AB (ref 65–99)
POTASSIUM: 3.9 mmol/L (ref 3.5–5.1)
Sodium: 136 mmol/L (ref 135–145)

## 2015-03-23 LAB — PREGNANCY, URINE: PREG TEST UR: NEGATIVE

## 2015-03-23 LAB — POCT PREGNANCY, URINE: Preg Test, Ur: NEGATIVE

## 2015-03-23 MED ORDER — ACETAMINOPHEN 500 MG PO TABS: 1000.0000 mg | ORAL_TABLET | Freq: Once | ORAL | Status: DC

## 2015-03-23 MED ORDER — SODIUM CHLORIDE 0.9 % IV BOLUS (SEPSIS)
500.0000 mL | Freq: Once | INTRAVENOUS | Status: AC
  Administered 2015-03-23: 500 mL via INTRAVENOUS

## 2015-03-23 NOTE — ED Notes (Signed)
Pt states she had a facial sauna today and states that after having the steam treatment for approx 9 min she felt weak, nauseated and unable to eat. Pt states that while waiting in the lobby she ate a yogurt, part of a salad, and a mcdouble. Pt states that her arms feel heavy

## 2015-03-23 NOTE — ED Provider Notes (Signed)
[facility] Emergency Department Provider Note ____________________________________________  Time seen: 1515  I have reviewed the triage vital signs and the nursing notes.  HISTORY  Chief Complaint  Weakness  HPI Deborah Nichols is a 29 y.o. female reports to the ED for evaluation of some transient episode of weakness and nausea following a spot treatment today. She does admit going better than 18 hours without eating since last night. She woke up this morning late for an appointment, and did not have breakfast. She went to the dermatologist and then to a spa visit,where she had steam facial. She reports onset of nausea and weakness. She drove herself to [employer] to eat, but was unable to finish her meal. She now reports here for treatment of what she assumes is dehydration. She reports being able to finish her meal while waiting in the lobby. She denies nausea or vomiting at this time. She notes only a mild headache and the sense of heaviness to her arms and legs.   Past Medical History  Diagnosis Date  . Chronic back pain   . Sciatica   . Chest pain     Patient Active Problem List   Diagnosis Date Noted  . Sciatica   . Chest pain   . Back pain 03/03/2013    Past Surgical History  Procedure Laterality Date  . Cholecystectomy      No current outpatient prescriptions on file.  Allergies Shellfish allergy  Family History  Problem Relation Age of Onset  . Hypertension Father   . Diabetes Father   . Coronary artery disease Sister     Social History Social History  Substance Use Topics  . Smoking status: Former Smoker    Types: Cigarettes  . Smokeless tobacco: Not on file  . Alcohol Use: No   Review of Systems  Constitutional: Negative for fever. Eyes: Negative for visual changes. ENT: Negative for sore throat. Cardiovascular: Negative for chest pain. Respiratory: Negative for shortness of breath. Gastrointestinal: Negative for abdominal  pain, vomiting and diarrhea. Reports resolved nausea. Genitourinary: Negative for dysuria. Musculoskeletal: Negative for back pain. Skin: Negative for rash. Neurological: Negative for focal weakness or numbness. Reports headache. ____________________________________________  PHYSICAL EXAM:  VITAL SIGNS: ED Triage Vitals  Enc Vitals Group     BP 03/23/15 1445 117/64 mmHg     Pulse Rate 03/23/15 1445 82     Resp 03/23/15 1445 16     Temp 03/23/15 1445 97.8 F (36.6 C)     Temp Source 03/23/15 1445 Oral     SpO2 03/23/15 1445 98 %     Weight 03/23/15 1445 202 lb (91.627 kg)     Height 03/23/15 1445 5\' 3"  (1.6 m)     Head Cir --      Peak Flow --      Pain Score --      Pain Loc --      Pain Edu? --      Excl. in [place]? --    Constitutional: Alert and oriented. Well appearing and in no distress. Head: Normocephalic and atraumatic.      Eyes: Conjunctivae are normal. PERRL. Normal extraocular movements      Ears: Canals clear. TMs intact bilaterally.   Nose: No congestion/rhinorrhea.   Mouth/Throat: Mucous membranes are moist.   Neck: Supple. No thyromegaly. Hematological/Lymphatic/Immunological: No cervical lymphadenopathy. Cardiovascular: Normal rate, regular rhythm.  Respiratory: Normal respiratory effort. No wheezes/rales/rhonchi. Gastrointestinal: Soft and nontender. No distention. Musculoskeletal: Nontender with normal range of  motion in all extremities.  Neurologic:  Normal gait without ataxia. Normal speech and language. No gross focal neurologic deficits are appreciated. Skin:  Skin is warm, dry and intact. No rash noted. Psychiatric: Mood and affect are normal. Patient exhibits appropriate insight and judgment. ____________________________________________   LABS (pertinent positives/negatives) Labs Reviewed  BASIC METABOLIC PANEL - Abnormal; Notable for the following:    Glucose, Bld 107 (*)    Anion gap 3 (*)    All other components within normal limits   CBC WITH DIFFERENTIAL/PLATELET - Abnormal; Notable for the following:    Neutro Abs 6.9 (*)    All other components within normal limits  URINALYSIS COMPLETEWITH MICROSCOPIC ([facility] ONLY) - Abnormal; Notable for the following:    Color, Urine YELLOW (*)    APPearance CLEAR (*)    Hgb urine dipstick 1+ (*)    Squamous Epithelial / LPF 0-5 (*)    All other components within normal limits  PREGNANCY, URINE  POCT PREGNANCY, URINE  ____________________________________________  PROCEDURES  NS 500 ml bolus ____________________________________________  INITIAL IMPRESSION / ASSESSMENT AND PLAN / ED COURSE  Patient with normal vital signs, normal exam, and normal labs following a transient episode of nausea and hypoglycemia following a spa treatment. Patient reports improvement of her headache pain and energy following a few 100 L of fluid. She will be discharged home and follow with primary care provider she is encouraged to make sure that she eats every 2-3 hours to prevent dehydration and probable hypoglycemia. ____________________________________________  FINAL CLINICAL IMPRESSION(S) / ED DIAGNOSES  Final diagnoses:  Nausea in adult  Hypoglycemia      Melvenia Needles, PA-C 03/23/15 [place], MD 03/23/15 1651

## 2015-03-23 NOTE — Discharge Instructions (Signed)
Nausea and Vomiting Nausea means you feel sick to your stomach. Throwing up (vomiting) is a reflex where stomach contents come out of your mouth. HOME CARE   Take medicine as told by your doctor.  Do not force yourself to eat. However, you do need to drink fluids.  If you feel like eating, eat a normal diet as told by your doctor.  Eat rice, wheat, potatoes, bread, lean meats, yogurt, fruits, and vegetables.  Avoid high-fat foods.  Drink enough fluids to keep your pee (urine) clear or pale yellow.  Ask your doctor how to replace body fluid losses (rehydrate). Signs of body fluid loss (dehydration) include:  Feeling very thirsty.  Dry lips and mouth.  Feeling dizzy.  Dark pee.  Peeing less than normal.  Feeling confused.  Fast breathing or heart rate. GET HELP RIGHT AWAY IF:   You have blood in your throw up.  You have black or bloody poop (stool).  You have a bad headache or stiff neck.  You feel confused.  You have bad belly (abdominal) pain.  You have chest pain or trouble breathing.  You do not pee at least once every 8 hours.  You have cold, clammy skin.  You keep throwing up after 24 to 48 hours.  You have a fever. MAKE SURE YOU:   Understand these instructions.  Will watch your condition.  Will get help right away if you are not doing well or get worse.   This information is not intended to replace advice given to you by your health care provider. Make sure you discuss any questions you have with your health care provider.   Document Released: 11/05/2007 Document Revised: 08/11/2011 Document Reviewed: 10/18/2010 Elsevier Interactive Patient Education 2016 Elsevier Inc.  Hypoglycemia Low blood sugar (hypoglycemia) means that the level of sugar in your blood is lower than it should be. Signs of low blood sugar include:  Getting sweaty.  Feeling hungry.  Feeling dizzy or weak.  Feeling sleepier than normal.  Feeling  nervous.  Headaches.  Having a fast heartbeat. Low blood sugar can happen fast and can be an emergency. Your doctor can do tests to check your blood sugar level. You can have low blood sugar and not have diabetes. HOME CARE  Check your blood sugar as told by your doctor. If it is less than 70 mg/dl or as told by your doctor, take 1 of the following:  3 to 4 glucose tablets.   cup clear juice.   cup soda pop, not diet.  1 cup milk.  5 to 6 hard candies.  Recheck blood sugar after 15 minutes. Repeat until it is at the right level.  Eat a snack if it is more than 1 hour until the next meal.  Only take medicine as told by your doctor.  Do not skip meals. Eat on time.  Do not drink alcohol except with meals.  Check your blood glucose before driving.  Check your blood glucose before and after exercise.  Always carry treatment with you, such as glucose pills.  Always wear a medical alert bracelet if you have diabetes. GET HELP RIGHT AWAY IF:   Your blood glucose goes below 70 mg/dl or as told by your doctor, and you:  Are confused.  Are not able to swallow.  Pass out (faint).  You cannot treat yourself. You may need someone to help you.  You have low blood sugar problems often.  You have problems from your medicines.  You  are not feeling better after 3 to 4 days.  You have vision changes. MAKE SURE YOU:   Understand these instructions.  Will watch this condition.  Will get help right away if you are not doing well or get worse.   This information is not intended to replace advice given to you by your health care provider. Make sure you discuss any questions you have with your health care provider.   Document Released: 08/13/2009 Document Revised: 06/09/2014 Document Reviewed: 01/23/2015 Elsevier Interactive Patient Education [employer].   Your labs and exam are normal today following your mild episode of probable hypoglycemia (low blood sugar).  This can occur if you go too long without eating properly. Continue to monitor your food and fluid intake.  Follow-up with your primary provider as needed.

## 2015-05-12 ENCOUNTER — Encounter (HOSPITAL_COMMUNITY): Payer: Self-pay | Admitting: Emergency Medicine

## 2015-05-12 DIAGNOSIS — R202 Paresthesia of skin: Secondary | ICD-10-CM | POA: Diagnosis not present

## 2015-05-12 DIAGNOSIS — R197 Diarrhea, unspecified: Secondary | ICD-10-CM | POA: Diagnosis not present

## 2015-05-12 DIAGNOSIS — R109 Unspecified abdominal pain: Secondary | ICD-10-CM | POA: Diagnosis present

## 2015-05-12 DIAGNOSIS — Z8719 Personal history of other diseases of the digestive system: Secondary | ICD-10-CM | POA: Diagnosis not present

## 2015-05-12 DIAGNOSIS — Z79899 Other long term (current) drug therapy: Secondary | ICD-10-CM | POA: Insufficient documentation

## 2015-05-12 DIAGNOSIS — Z8739 Personal history of other diseases of the musculoskeletal system and connective tissue: Secondary | ICD-10-CM | POA: Insufficient documentation

## 2015-05-12 DIAGNOSIS — Z87891 Personal history of nicotine dependence: Secondary | ICD-10-CM | POA: Insufficient documentation

## 2015-05-12 DIAGNOSIS — G8929 Other chronic pain: Secondary | ICD-10-CM | POA: Diagnosis not present

## 2015-05-12 DIAGNOSIS — R110 Nausea: Secondary | ICD-10-CM | POA: Diagnosis not present

## 2015-05-12 NOTE — ED Notes (Signed)
Pt reports for the last month she has had diarrhea with what looks like worms in the stool. Pt c.o headache as well.

## 2015-05-13 ENCOUNTER — Emergency Department (HOSPITAL_COMMUNITY)
Admission: EM | Admit: 2015-05-13 | Discharge: 2015-05-13 | Disposition: A | Discharge status: Home / Self Care | Attending: Emergency Medicine | Admitting: Emergency Medicine

## 2015-05-13 DIAGNOSIS — R197 Diarrhea, unspecified: Secondary | ICD-10-CM

## 2015-05-13 DIAGNOSIS — R110 Nausea: Secondary | ICD-10-CM

## 2015-05-13 DIAGNOSIS — R202 Paresthesia of skin: Secondary | ICD-10-CM

## 2015-05-13 LAB — I-STAT BETA HCG BLOOD, ED (MC, WL, AP ONLY)

## 2015-05-13 LAB — COMPREHENSIVE METABOLIC PANEL
ALK PHOS: 57 U/L (ref 38–126)
ALT: 25 U/L (ref 14–54)
AST: 20 U/L (ref 15–41)
Albumin: 3.5 g/dL (ref 3.5–5.0)
Anion gap: 5 (ref 5–15)
BUN: 15 mg/dL (ref 6–20)
CALCIUM: 8.7 mg/dL — AB (ref 8.9–10.3)
CHLORIDE: 107 mmol/L (ref 101–111)
CO2: 27 mmol/L (ref 22–32)
CREATININE: 0.86 mg/dL (ref 0.44–1.00)
Glucose, Bld: 129 mg/dL — ABNORMAL HIGH (ref 65–99)
Potassium: 3.5 mmol/L (ref 3.5–5.1)
SODIUM: 139 mmol/L (ref 135–145)
Total Bilirubin: 0.4 mg/dL (ref 0.3–1.2)
Total Protein: 6.6 g/dL (ref 6.5–8.1)

## 2015-05-13 LAB — URINALYSIS, ROUTINE W REFLEX MICROSCOPIC
BILIRUBIN URINE: NEGATIVE
GLUCOSE, UA: NEGATIVE mg/dL
KETONES UR: NEGATIVE mg/dL
Nitrite: NEGATIVE
PROTEIN: NEGATIVE mg/dL
Specific Gravity, Urine: 1.018 (ref 1.005–1.030)
pH: 6.5 (ref 5.0–8.0)

## 2015-05-13 LAB — URINE MICROSCOPIC-ADD ON

## 2015-05-13 LAB — LIPASE, BLOOD: LIPASE: 29 U/L (ref 11–51)

## 2015-05-13 LAB — CBC
HCT: 41.1 % (ref 36.0–46.0)
Hemoglobin: 13.6 g/dL (ref 12.0–15.0)
MCH: 29.1 pg (ref 26.0–34.0)
MCHC: 33.1 g/dL (ref 30.0–36.0)
MCV: 87.8 fL (ref 78.0–100.0)
PLATELETS: 228 10*3/uL (ref 150–400)
RBC: 4.68 MIL/uL (ref 3.87–5.11)
RDW: 12.5 % (ref 11.5–15.5)
WBC: 8.1 10*3/uL (ref 4.0–10.5)

## 2015-05-13 MED ORDER — ONDANSETRON HCL 4 MG PO TABS: 4.0000 mg | ORAL_TABLET | Freq: Four times a day (QID) | ORAL | Status: DC | PRN

## 2015-05-13 NOTE — ED Provider Notes (Signed)
CSN: [id number]     Arrival date & time 05/12/15  2345 History   By signing my name below, I, Deborah Nichols, attest that this documentation has been prepared under the direction and in the presence of Delora Fuel, MD.  Electronically Signed: Forrestine Nichols, ED Scribe. 05/13/2015. 2:18 AM.   Chief Complaint  Patient presents with  . Abdominal Pain   The history is provided by the patient. No language interpreter was used.    HPI Comments: Deborah Nichols is a 29 y.o. female with a PMHx of IBS and GERD who presents to the Emergency Department complaining of intermittent, ongoing diffuse abdominal pain x 6 months. Pain is described as cramping and burning. No aggravating or alleviating factors at this time. Ongoing diarrhea, nausea, urinary frequency, chills, and intermittent numbness to extremities also reported. No OTC medications or home remedies attempted prior to arrival. Pt denies any recent fever, chills, or vomiting. No recent long distance travel. Pt states she has 2 puppies, however, they have both been de-wormed. Ms. Wonder states she noted possible parasites in her feces this evening and prepared a sample with her this evening for evaluation. Pt has been followed multiple times by [facility] for same. PSHx includes cholecystectomy 2009.  PCP: Delia Chimes, NP    Past Medical History  Diagnosis Date  . Chronic back pain   . Sciatica   . Chest pain    Past Surgical History  Procedure Laterality Date  . Cholecystectomy     Family History  Problem Relation Age of Onset  . Hypertension Father   . Diabetes Father   . Coronary artery disease Sister    Social History  Substance Use Topics  . Smoking status: Former Smoker    Types: Cigarettes  . Smokeless tobacco: None  . Alcohol Use: No   OB History    No data available     Review of Systems  Constitutional: Positive for chills. Negative for fever.  Eyes: Positive for visual disturbance.  Respiratory: Negative for  shortness of breath.   Cardiovascular: Negative for chest pain.  Gastrointestinal: Positive for nausea, abdominal pain and diarrhea. Negative for vomiting.  Genitourinary: Positive for frequency.  Neurological: Positive for numbness. Negative for headaches.  Psychiatric/Behavioral: Negative for confusion.  All other systems reviewed and are negative.     Allergies  Shellfish allergy  Home Medications   Prior to Admission medications   Medication Sig Start Date End Date Taking? Authorizing Provider  Multiple Vitamin (MULTIVITAMIN WITH MINERALS) TABS tablet Take 1 tablet by mouth once a week.   Yes Historical Provider, MD   Triage Vitals: BP 120/67 mmHg  Pulse 85  Temp(Src) 99.2 F (37.3 C) (Oral)  Resp 16  Ht '5\' 3"'  (1.6 m)  Wt 202 lb (91.627 kg)  BMI 35.79 kg/m2  SpO2 97%  LMP 04/30/2015   Physical Exam  Constitutional: She is oriented to person, place, and time. She appears well-developed and well-nourished. No distress.  HENT:  Head: Normocephalic and atraumatic.  Eyes: EOM are normal. Pupils are equal, round, and reactive to light.  Neck: Normal range of motion. Neck supple. No JVD present.  Cardiovascular: Normal rate, regular rhythm and normal heart sounds.   Pulmonary/Chest: Effort normal and breath sounds normal. She has no wheezes. She has no rales. She exhibits no tenderness.  Abdominal: Soft. Bowel sounds are normal. She exhibits no distension and no mass. There is no tenderness.  Musculoskeletal: Normal range of motion. She exhibits no edema.  Lymphadenopathy:  She has no cervical adenopathy.  Neurological: She is alert and oriented to person, place, and time. No cranial nerve deficit. Coordination normal.  Skin: Skin is warm and dry. No rash noted.  Psychiatric: She has a normal mood and affect. Her behavior is normal. Judgment and thought content normal.  Nursing note and vitals reviewed.   ED Course  Procedures (including critical care  time)  DIAGNOSTIC STUDIES: Oxygen Saturation is 97% on RA, Normal by my interpretation.    COORDINATION OF CARE: 1:54 AM- Will order Ova + parasite exam, Lipase, CMP, CBC, urinalysis, and i-stat beta hCG blood. Discussed treatment plan with pt at bedside and pt agreed to plan.     Labs Review Results for orders placed or performed during the hospital encounter of 05/13/15  Lipase, blood  Result Value Ref Range   Lipase 29 11 - 51 U/L  Comprehensive metabolic panel  Result Value Ref Range   Sodium 139 135 - 145 mmol/L   Potassium 3.5 3.5 - 5.1 mmol/L   Chloride 107 101 - 111 mmol/L   CO2 27 22 - 32 mmol/L   Glucose, Bld 129 (H) 65 - 99 mg/dL   BUN 15 6 - 20 mg/dL   Creatinine, Ser 0.86 0.44 - 1.00 mg/dL   Calcium 8.7 (L) 8.9 - 10.3 mg/dL   Total Protein 6.6 6.5 - 8.1 g/dL   Albumin 3.5 3.5 - 5.0 g/dL   AST 20 15 - 41 U/L   ALT 25 14 - 54 U/L   Alkaline Phosphatase 57 38 - 126 U/L   Total Bilirubin 0.4 0.3 - 1.2 mg/dL   GFR calc non Af Amer >60 >60 mL/min   GFR calc Af Amer >60 >60 mL/min   Anion gap 5 5 - 15  CBC  Result Value Ref Range   WBC 8.1 4.0 - 10.5 K/uL   RBC 4.68 3.87 - 5.11 MIL/uL   Hemoglobin 13.6 12.0 - 15.0 g/dL   HCT 41.1 36.0 - 46.0 %   MCV 87.8 78.0 - 100.0 fL   MCH 29.1 26.0 - 34.0 pg   MCHC 33.1 30.0 - 36.0 g/dL   RDW 12.5 11.5 - 15.5 %   Platelets 228 150 - 400 K/uL  Urinalysis, Routine w reflex microscopic (not at [facility])  Result Value Ref Range   Color, Urine YELLOW YELLOW   APPearance CLEAR CLEAR   Specific Gravity, Urine 1.018 1.005 - 1.030   pH 6.5 5.0 - 8.0   Glucose, UA NEGATIVE NEGATIVE mg/dL   Hgb urine dipstick SMALL (A) NEGATIVE   Bilirubin Urine NEGATIVE NEGATIVE   Ketones, ur NEGATIVE NEGATIVE mg/dL   Protein, ur NEGATIVE NEGATIVE mg/dL   Nitrite NEGATIVE NEGATIVE   Leukocytes, UA MODERATE (A) NEGATIVE  Urine microscopic-add on  Result Value Ref Range   Squamous Epithelial / LPF 6-30 (A) NONE SEEN   WBC, UA 0-5 0 - 5 WBC/hpf    RBC / HPF 0-5 0 - 5 RBC/hpf   Bacteria, UA FEW (A) NONE SEEN  I-Stat beta hCG blood, ED ([facility], [facility], [facility] only)  Result Value Ref Range   I-stat hCG, quantitative <5.0 <5 mIU/mL   Comment 3           I have personally reviewed and evaluated these images and lab results as part of my medical decision-making.   MDM   Final diagnoses:  Nausea  Diarrhea, unspecified type  Paresthesia    Patient with multiple complaints but her major problems seem  to be related to irritable bowel syndrome. She has ongoing problems with nausea and diarrhea and also concerned that she may have worms. Concern about worms is related to review of information on the Internet and not actual worms that she has seen P has brought a stool sample with her. She expresses that her concerns have not been adequately met when she did see her gastroenterologist. Actually, I do feel that her symptom complexes right consistent with irritable bowel syndrome and I have explained that to the patient. A stool sample is sent for ova and parasites. Her most pressing concerns today are the nausea and diarrhea. She is discharged with prescription for ondansetron for nausea and told to use over-the-counter loperamide for diarrhea. I have also suggested that she may wish to get a second opinion from one of the [facility] gastroenterology clinics.  I personally performed the services described in this documentation, which was scribed in my presence. The recorded information has been reviewed and is accurate.       Delora Fuel, MD 99/35/70 [id number]

## 2015-05-13 NOTE — Discharge Instructions (Signed)
Take loperamide (Imodium AD) as needed for diarrhea.  Consider going to one of the [facility] neurology departments for a second opinion.  Nausea, Adult Nausea is the feeling that you have an upset stomach or have to vomit. Nausea by itself is not likely a serious concern, but it may be an early sign of more serious medical problems. As nausea gets worse, it can lead to vomiting. If vomiting develops, there is the risk of dehydration.  CAUSES   Viral infections.  Food poisoning.  Medicines.  Pregnancy.  Motion sickness.  Migraine headaches.  Emotional distress.  Severe pain from any source.  Alcohol intoxication. HOME CARE INSTRUCTIONS  Get plenty of rest.  Ask your caregiver about specific rehydration instructions.  Eat small amounts of food and sip liquids more often.  Take all medicines as told by your caregiver. SEEK MEDICAL CARE IF:  You have not improved after 2 days, or you get worse.  You have a headache. SEEK IMMEDIATE MEDICAL CARE IF:   You have a fever.  You faint.  You keep vomiting or have blood in your vomit.  You are extremely weak or dehydrated.  You have dark or bloody stools.  You have severe chest or abdominal pain. MAKE SURE YOU:  Understand these instructions.  Will watch your condition.  Will get help right away if you are not doing well or get worse.   This information is not intended to replace advice given to you by your health care provider. Make sure you discuss any questions you have with your health care provider.   Document Released: 06/26/2004 Document Revised: 06/09/2014 Document Reviewed: 01/29/2011 Elsevier Interactive Patient Education 2016 [place].  Diarrhea Diarrhea is frequent loose and watery bowel movements. It can cause you to feel weak and dehydrated. Dehydration can cause you to become tired and thirsty, have a dry mouth, and have decreased urination that often is dark yellow. Diarrhea is a sign  of another problem, most often an infection that will not last long. In most cases, diarrhea typically lasts 2-3 days. However, it can last longer if it is a sign of something more serious. It is important to treat your diarrhea as directed by your caregiver to lessen or prevent future episodes of diarrhea. CAUSES  Some common causes include:  Gastrointestinal infections caused by viruses, bacteria, or parasites.  Food poisoning or food allergies.  Certain medicines, such as antibiotics, chemotherapy, and laxatives.  Artificial sweeteners and fructose.  Digestive disorders. HOME CARE INSTRUCTIONS  Ensure adequate fluid intake (hydration): Have 1 cup (8 oz) of fluid for each diarrhea episode. Avoid fluids that contain simple sugars or sports drinks, fruit juices, whole milk products, and sodas. Your urine should be clear or pale yellow if you are drinking enough fluids. Hydrate with an oral rehydration solution that you can purchase at pharmacies, retail stores, and online. You can prepare an oral rehydration solution at home by mixing the following ingredients together:   - tsp table salt.   tsp baking soda.   tsp salt substitute containing potassium chloride.  1  tablespoons sugar.  1 L (34 oz) of water.  Certain foods and beverages may increase the speed at which food moves through the gastrointestinal (GI) tract. These foods and beverages should be avoided and include:  Caffeinated and alcoholic beverages.  High-fiber foods, such as raw fruits and vegetables, nuts, seeds, and whole grain breads and cereals.  Foods and beverages sweetened with sugar alcohols, such as xylitol, sorbitol,  and mannitol.  Some foods may be well tolerated and may help thicken stool including:  Starchy foods, such as rice, toast, pasta, low-sugar cereal, oatmeal, grits, baked potatoes, crackers, and bagels.  Bananas.  Applesauce.  Add probiotic-rich foods to help increase healthy bacteria in  the GI tract, such as yogurt and fermented milk products.  Wash your hands well after each diarrhea episode.  Only take over-the-counter or prescription medicines as directed by your caregiver.  Take a warm bath to relieve any burning or pain from frequent diarrhea episodes. SEEK IMMEDIATE MEDICAL CARE IF:   You are unable to keep fluids down.  You have persistent vomiting.  You have blood in your stool, or your stools are black and tarry.  You do not urinate in 6-8 hours, or there is only a small amount of very dark urine.  You have abdominal pain that increases or localizes.  You have weakness, dizziness, confusion, or light-headedness.  You have a severe headache.  Your diarrhea gets worse or does not get better.  You have a fever or persistent symptoms for more than 2-3 days.  You have a fever and your symptoms suddenly get worse. MAKE SURE YOU:   Understand these instructions.  Will watch your condition.  Will get help right away if you are not doing well or get worse.   This information is not intended to replace advice given to you by your health care provider. Make sure you discuss any questions you have with your health care provider.   Document Released: 05/09/2002 Document Revised: 06/09/2014 Document Reviewed: 01/25/2012 Elsevier Interactive Patient Education [employer].

## 2015-05-13 NOTE — ED Notes (Addendum)
Patient states she has been having intermittent abdominal cramping x 6 months.  Pt states she noticed worms in her rice tonight and then went digging through her feces and found parasites.  Pt states she brought a sample with her.    Pt also states she has had migraines this week which "make my head feel tight" and has had right leg weakness and numbness intermittently (denies at this time).  Pt is concerned she is "filled with parasites"

## 2015-05-15 LAB — OVA + PARASITE EXAM

## 2015-05-15 LAB — O&P RESULT

## 2015-07-05 ENCOUNTER — Telehealth: Payer: Self-pay | Admitting: Interventional Cardiology

## 2015-07-05 DIAGNOSIS — R079 Chest pain, unspecified: Secondary | ICD-10-CM
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
## 2015-07-05 NOTE — Telephone Encounter (Signed)
OK for exercise treadmill test.  Can postpone appt with me until after stress test.

## 2015-07-05 NOTE — Telephone Encounter (Signed)
Pt c/o of Chest Pain: STAT if CP now or developed within 24 hours  1. Are you having CP right now? no  2. Are you experiencing any other symptoms (ex. SOB, nausea, vomiting, sweating)? Headache, heaviness in lower extrem  3. How long have you been experiencing CP? Yesterday 07/04/15  4. Is your CP continuous or coming and going? Comes/goes  5. Have you taken Nitroglycerin? no ?

## 2015-07-06 NOTE — Telephone Encounter (Signed)
Follow Up ° °Pt returned call//  °

## 2015-07-06 NOTE — Telephone Encounter (Signed)
**Note De-identified Deborah Nichols Obfuscation** LMTCB

## 2015-07-06 NOTE — Telephone Encounter (Signed)
Returned call to patient and let her know that Dr. Irish Lack said that she could postpone her appointment until after her stress test.  Told her I would place the order and scheduling would call to plan with her a date and time

## 2015-07-09 ENCOUNTER — Ambulatory Visit (INDEPENDENT_AMBULATORY_CARE_PROVIDER_SITE_OTHER): Admitting: Interventional Cardiology

## 2015-07-09 ENCOUNTER — Ambulatory Visit (HOSPITAL_COMMUNITY)
Admission: RE | Admit: 2015-07-09 | Discharge: 2015-07-09 | Disposition: A | Discharge status: Home / Self Care | Source: Ambulatory Visit | Attending: Interventional Cardiology | Admitting: Interventional Cardiology

## 2015-07-09 ENCOUNTER — Encounter: Payer: Self-pay | Admitting: Interventional Cardiology

## 2015-07-09 VITALS — BP 110/62 | HR 82 | Ht 63.0 in | Wt 208.1 lb

## 2015-07-09 DIAGNOSIS — R0602 Shortness of breath: Secondary | ICD-10-CM | POA: Diagnosis not present

## 2015-07-09 DIAGNOSIS — R519 Headache, unspecified: Secondary | ICD-10-CM

## 2015-07-09 DIAGNOSIS — R071 Chest pain on breathing: Secondary | ICD-10-CM | POA: Diagnosis not present

## 2015-07-09 DIAGNOSIS — R079 Chest pain, unspecified: Secondary | ICD-10-CM

## 2015-07-09 DIAGNOSIS — R51 Headache: Secondary | ICD-10-CM | POA: Diagnosis not present
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
## 2015-07-09 NOTE — Patient Instructions (Addendum)
Medication Instructions:  Your physician recommends that you continue on your current medications as directed. Please refer to the Current Medication list given to you today.   Labwork: NONE ORDERED  Testing/Procedures: Your physician has requested that you have an echocardiogram. Echocardiography is a painless test that uses sound waves to create images of your heart. It provides your doctor with information about the size and shape of your heart and how well your heart's chambers and valves are working. This procedure takes approximately one hour. There are no restrictions for this procedure.    Follow-Up: Your physician wants you to follow-up in:  AS NEEDED   Any Other Special Instructions Will Be Listed Below (If Applicable).  Echocardiogram An echocardiogram, or echocardiography, uses sound waves (ultrasound) to produce an image of your heart. The echocardiogram is simple, painless, obtained within a short period of time, and offers valuable information to your health care provider. The images from an echocardiogram can provide information such as:  Evidence of coronary artery disease (CAD).  Heart size.  Heart muscle function.  Heart valve function.  Aneurysm detection.  Evidence of a past heart attack.  Fluid buildup around the heart.  Heart muscle thickening.  Assess heart valve function. LET [facility] CARE PROVIDER KNOW ABOUT:  Any allergies you have.  All medicines you are taking, including vitamins, herbs, eye drops, creams, and over-the-counter medicines.  Previous problems you or members of your family have had with the use of anesthetics.  Any blood disorders you have.  Previous surgeries you have had.  Medical conditions you have.  Possibility of pregnancy, if this applies. BEFORE THE PROCEDURE  No special preparation is needed. Eat and drink normally.  PROCEDURE   In order to produce an image of your heart, gel will be applied to your chest  and a wand-like tool (transducer) will be moved over your chest. The gel will help transmit the sound waves from the transducer. The sound waves will harmlessly bounce off your heart to allow the heart images to be captured in real-time motion. These images will then be recorded.  You may need an IV to receive a medicine that improves the quality of the pictures. AFTER THE PROCEDURE You may return to your normal schedule including diet, activities, and medicines, unless your health care provider tells you otherwise.   This information is not intended to replace advice given to you by your health care provider. Make sure you discuss any questions you have with your health care provider.   Document Released: 05/16/2000 Document Revised: 06/09/2014 Document Reviewed: 01/24/2013 Elsevier Interactive Patient Education [employer].    If you need a refill on your cardiac medications before your next appointment, please call your pharmacy.

## 2015-09-05 ENCOUNTER — Telehealth: Payer: Self-pay | Admitting: Interventional Cardiology

## 2015-09-05 NOTE — Telephone Encounter (Signed)
**Note De-identified Rece Zechman Obfuscation** The pt is advised and she verbalized understanding. 

## 2015-09-05 NOTE — Telephone Encounter (Signed)
Heart w/u has been negative.  I would have her f/u with PMD to see if further w/u for head sx is required.

## 2015-09-05 NOTE — Telephone Encounter (Signed)
**Note De-Identified Meko Masterson Obfuscation** The pt continues to have left chest pressure and pressure on the left side of her brain. She wants Dr Irish Lack to know that she is now having "adrenaline rushes" with light activity. She states that she is not taking any new medications that would cause new s/s.  She wants to know what she should do at this time. Please advise.

## 2015-09-05 NOTE — Telephone Encounter (Signed)
Pt calling re having "adrenaline rushes" with light activity- what to do? pls call [phone]

## 2015-09-07 ENCOUNTER — Ambulatory Visit: Admitting: Family Medicine

## 2015-09-07 ENCOUNTER — Other Ambulatory Visit: Payer: Self-pay | Admitting: Obstetrics & Gynecology

## 2015-09-07 DIAGNOSIS — N644 Mastodynia: Secondary | ICD-10-CM

## 2015-09-10 ENCOUNTER — Ambulatory Visit: Admitting: Family Medicine

## 2015-09-10 DIAGNOSIS — Z0289 Encounter for other administrative examinations: Secondary | ICD-10-CM

## 2015-09-13 ENCOUNTER — Encounter

## 2015-09-13 ENCOUNTER — Other Ambulatory Visit

## 2015-11-06 ENCOUNTER — Ambulatory Visit
Admission: RE | Admit: 2015-11-06 | Discharge: 2015-11-06 | Disposition: A | Discharge status: Home / Self Care | Source: Ambulatory Visit | Attending: Obstetrics & Gynecology | Admitting: Obstetrics & Gynecology

## 2015-11-06 DIAGNOSIS — N644 Mastodynia: Secondary | ICD-10-CM

## 2016-09-15 ENCOUNTER — Encounter: Payer: Self-pay | Admitting: *Deleted

## 2016-09-15 ENCOUNTER — Emergency Department

## 2016-09-15 ENCOUNTER — Emergency Department
Admission: EM | Admit: 2016-09-15 | Discharge: 2016-09-15 | Disposition: A | Discharge status: Home / Self Care | Attending: Emergency Medicine | Admitting: Emergency Medicine

## 2016-09-15 DIAGNOSIS — Y999 Unspecified external cause status: Secondary | ICD-10-CM | POA: Insufficient documentation

## 2016-09-15 DIAGNOSIS — R55 Syncope and collapse: Secondary | ICD-10-CM | POA: Insufficient documentation

## 2016-09-15 DIAGNOSIS — Y929 Unspecified place or not applicable: Secondary | ICD-10-CM | POA: Diagnosis not present

## 2016-09-15 DIAGNOSIS — Y9389 Activity, other specified: Secondary | ICD-10-CM | POA: Diagnosis not present

## 2016-09-15 DIAGNOSIS — S0993XA Unspecified injury of face, initial encounter: Secondary | ICD-10-CM | POA: Diagnosis present

## 2016-09-15 DIAGNOSIS — Z79899 Other long term (current) drug therapy: Secondary | ICD-10-CM | POA: Diagnosis not present

## 2016-09-15 DIAGNOSIS — Z87891 Personal history of nicotine dependence: Secondary | ICD-10-CM | POA: Diagnosis not present

## 2016-09-15 DIAGNOSIS — W1839XA Other fall on same level, initial encounter: Secondary | ICD-10-CM | POA: Diagnosis not present

## 2016-09-15 DIAGNOSIS — S0083XA Contusion of other part of head, initial encounter: Secondary | ICD-10-CM | POA: Insufficient documentation

## 2016-09-15 LAB — CBC
HEMATOCRIT: 39.4 % (ref 35.0–47.0)
Hemoglobin: 13.4 g/dL (ref 12.0–16.0)
MCH: 29.3 pg (ref 26.0–34.0)
MCHC: 33.9 g/dL (ref 32.0–36.0)
MCV: 86.6 fL (ref 80.0–100.0)
Platelets: 213 10*3/uL (ref 150–440)
RBC: 4.56 MIL/uL (ref 3.80–5.20)
RDW: 13 % (ref 11.5–14.5)
WBC: 9.2 10*3/uL (ref 3.6–11.0)

## 2016-09-15 LAB — BASIC METABOLIC PANEL
Anion gap: 4 — ABNORMAL LOW (ref 5–15)
BUN: 11 mg/dL (ref 6–20)
CHLORIDE: 107 mmol/L (ref 101–111)
CO2: 26 mmol/L (ref 22–32)
Calcium: 8.7 mg/dL — ABNORMAL LOW (ref 8.9–10.3)
Creatinine, Ser: 0.61 mg/dL (ref 0.44–1.00)
GFR calc Af Amer: >60 mL/min (ref >60)
GFR calc non Af Amer: >60 mL/min (ref >60)
Glucose, Bld: 125 mg/dL — ABNORMAL HIGH (ref 65–99)
POTASSIUM: 3.7 mmol/L (ref 3.5–5.1)
Sodium: 137 mmol/L (ref 135–145)

## 2016-09-15 LAB — URINALYSIS, COMPLETE (UACMP) WITH MICROSCOPIC
BACTERIA UA: NONE SEEN
Bilirubin Urine: NEGATIVE
Glucose, UA: NEGATIVE mg/dL
Ketones, ur: NEGATIVE mg/dL
Nitrite: NEGATIVE
PH: 7 (ref 5.0–8.0)
Protein, ur: NEGATIVE mg/dL
SPECIFIC GRAVITY, URINE: 1.02 (ref 1.005–1.030)

## 2016-09-15 LAB — POCT PREGNANCY, URINE: Preg Test, Ur: NEGATIVE
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
## 2016-09-15 NOTE — ED Triage Notes (Addendum)
Pt bib [facility] w/ c/o syncopal episode.  Pt sts tht she has been under stress recently, but no new health issues.  Pt currently A/OX4, c/o nausea w/ no emesis.  Per EMS, pt was lying down and then got up to answer door and passed out.  Pt did hit head, swelling and redness noted to L side of face.  Per EMS, pt was unconscious approx 1 min according to witness

## 2016-09-15 NOTE — ED Notes (Signed)
Patient transported to X-ray 

## 2016-11-26 ENCOUNTER — Emergency Department (HOSPITAL_COMMUNITY)
Admission: EM | Admit: 2016-11-26 | Discharge: 2016-11-26 | Disposition: A | Discharge status: Home / Self Care | Attending: Emergency Medicine | Admitting: Emergency Medicine

## 2016-11-26 ENCOUNTER — Encounter (HOSPITAL_COMMUNITY): Payer: Self-pay

## 2016-11-26 DIAGNOSIS — Z87891 Personal history of nicotine dependence: Secondary | ICD-10-CM | POA: Diagnosis not present

## 2016-11-26 DIAGNOSIS — R110 Nausea: Secondary | ICD-10-CM | POA: Diagnosis not present

## 2016-11-26 DIAGNOSIS — R197 Diarrhea, unspecified: Secondary | ICD-10-CM | POA: Insufficient documentation

## 2016-11-26 LAB — COMPREHENSIVE METABOLIC PANEL
ALT: 25 U/L (ref 14–54)
AST: 23 U/L (ref 15–41)
Albumin: 3.9 g/dL (ref 3.5–5.0)
Alkaline Phosphatase: 70 U/L (ref 38–126)
Anion gap: 9 (ref 5–15)
BILIRUBIN TOTAL: 0.3 mg/dL (ref 0.3–1.2)
BUN: 10 mg/dL (ref 6–20)
CALCIUM: 9.2 mg/dL (ref 8.9–10.3)
CO2: 22 mmol/L (ref 22–32)
CREATININE: 0.68 mg/dL (ref 0.44–1.00)
Chloride: 107 mmol/L (ref 101–111)
GFR calc Af Amer: >60 mL/min (ref >60)
Glucose, Bld: 116 mg/dL — ABNORMAL HIGH (ref 65–99)
Potassium: 3.7 mmol/L (ref 3.5–5.1)
Sodium: 138 mmol/L (ref 135–145)
TOTAL PROTEIN: 7 g/dL (ref 6.5–8.1)

## 2016-11-26 LAB — CBC
HCT: 40.3 % (ref 36.0–46.0)
Hemoglobin: 13.7 g/dL (ref 12.0–15.0)
MCH: 29.1 pg (ref 26.0–34.0)
MCHC: 34 g/dL (ref 30.0–36.0)
MCV: 85.6 fL (ref 78.0–100.0)
PLATELETS: 250 10*3/uL (ref 150–400)
RBC: 4.71 MIL/uL (ref 3.87–5.11)
RDW: 12.5 % (ref 11.5–15.5)
WBC: 9.4 10*3/uL (ref 4.0–10.5)

## 2016-11-26 LAB — LIPASE, BLOOD: Lipase: 25 U/L (ref 11–51)

## 2016-11-26 MED ORDER — ONDANSETRON HCL 4 MG/2ML IJ SOLN
4.0000 mg | Freq: Once | INTRAMUSCULAR | Status: DC
  Filled 2016-11-26: qty 2

## 2016-11-26 MED ORDER — SODIUM CHLORIDE 0.9 % IV BOLUS (SEPSIS): 1000.0000 mL | Freq: Once | INTRAVENOUS | Status: DC

## 2016-11-26 MED ORDER — LOPERAMIDE HCL 2 MG PO CAPS
4.0000 mg | ORAL_CAPSULE | Freq: Once | ORAL | Status: DC
  Filled 2016-11-26: qty 2

## 2016-11-26 NOTE — ED Notes (Signed)
Attempted to establish IV access for medication administration. Upon entering room PT and guess were not there. Bathrooms check and no pt seen. Pt did not come back for 10 minutes. Pt marked as eloped.

## 2016-11-26 NOTE — ED Triage Notes (Signed)
Pt states that she has been fasting for the past 20 days due to spiritual reasons. C/o of abd pain and nausea, diarrhea and weakness.

## 2016-11-26 NOTE — ED Provider Notes (Signed)
[facility] DEPT Provider Note   CSN: [id number] Arrival date & time: 11/26/16  0139     History   Chief Complaint Chief Complaint  Patient presents with  . Weakness  . Nausea    HPI Deborah Nichols is a 31 y.o. female.  The history is provided by the patient.  She complains of feeling generally weak. She has been on a diet which consists of one meal a day for the last 2 weeks. During that time, she is also had decreased fluid intake. Over the last 24 hours, she has developed nausea and diarrhea. She states that she has had 6 bowel movements over the last 3 hours. Bowel movements are watery but without blood or mucus. She denies fever or chills or sweats. She has noted some mild dizziness. She denies any sick contacts.  Past Medical History:  Diagnosis Date  . Chest pain   . Chronic back pain   . Sciatica     Patient Active Problem List   Diagnosis Date Noted  . Sciatica   . Chest pain   . Back pain 03/03/2013    Past Surgical History:  Procedure Laterality Date  . CHOLECYSTECTOMY      OB History    No data available       Home Medications    Prior to Admission medications   Medication Sig Start Date End Date Taking? Authorizing Provider  Multiple Vitamin (MULTIVITAMIN WITH MINERALS) TABS tablet Take 1 tablet by mouth once a week.    [provider]    Family History Family History  Problem Relation Age of Onset  . Hypertension Father   . Diabetes Father   . Coronary artery disease Sister     Social History Social History  Substance Use Topics  . Smoking status: Former Smoker    Types: Cigarettes  . Smokeless tobacco: Never Used  . Alcohol use No     Allergies   Shellfish allergy   Review of Systems Review of Systems  All other systems reviewed and are negative.    Physical Exam Updated Vital Signs BP (!) 111/55   Pulse 83   Temp 98.1 F (36.7 C) (Oral)   Resp 20   SpO2 97%   Physical Exam  Nursing note and vitals  reviewed.  31 year old female, resting comfortably and in no acute distress. Vital signs are normal. Oxygen saturation is 97%, which is normal. Head is normocephalic and atraumatic. PERRLA, EOMI. Oropharynx is clear. Neck is nontender and supple without adenopathy or JVD. Back is nontender and there is no CVA tenderness. Lungs are clear without rales, wheezes, or rhonchi. Chest is nontender. Heart has regular rate and rhythm without murmur. Abdomen is soft, flat, nontender without masses or hepatosplenomegaly and peristalsis is hypoactive. Extremities have no cyanosis or edema, full range of motion is present. Skin is warm and dry without rash. Neurologic: Mental status is normal, cranial nerves are intact, there are no motor or sensory deficits.  ED Treatments / Results  Labs (all labs ordered are listed, but only abnormal results are displayed) Labs Reviewed  COMPREHENSIVE METABOLIC PANEL - Abnormal; Notable for the following:       Result Value   Glucose, Bld 116 (*)    All other components within normal limits  LIPASE, BLOOD  CBC  URINALYSIS, ROUTINE W REFLEX MICROSCOPIC  DIFFERENTIAL    Procedures Procedures (including critical care time)  Medications Ordered in ED Medications  sodium chloride 0.9 % bolus 1,000  mL (not administered)  ondansetron (ZOFRAN) injection 4 mg (not administered)  loperamide (IMODIUM) capsule 4 mg (not administered)     Initial Impression / Assessment and Plan / ED Course  I have reviewed the triage vital signs and the nursing notes.  Pertinent lab results that were available during my care of the patient were reviewed by me and considered in my medical decision making (see chart for details).  Nausea, diarrhea in pattern strongly suggestive of viral gastroenteritis. No red flags to suggest more serious illness. Initial laboratory workup is unremarkable. She will be given IV fluids, ondansetron, and oral loperamide. Old records are reviewed,  and she has no relevant past visits.  Patient left the ED without telling anybody prior to my being able to evaluate response to treatment.  Final Clinical Impressions(s) / ED Diagnoses   Final diagnoses:  Diarrhea of presumed infectious origin  Nausea    New Prescriptions New Prescriptions   No medications on file     Delora Fuel, MD 05/39/76 [phone]

## 2016-12-30 ENCOUNTER — Emergency Department

## 2016-12-30 ENCOUNTER — Encounter: Payer: Self-pay | Admitting: Emergency Medicine

## 2016-12-30 ENCOUNTER — Emergency Department
Admission: EM | Admit: 2016-12-30 | Discharge: 2016-12-30 | Disposition: A | Discharge status: Home / Self Care | Attending: Student in an Organized Health Care Education/Training Program | Admitting: Student in an Organized Health Care Education/Training Program

## 2016-12-30 DIAGNOSIS — R202 Paresthesia of skin: Secondary | ICD-10-CM

## 2016-12-30 DIAGNOSIS — Z87891 Personal history of nicotine dependence: Secondary | ICD-10-CM | POA: Insufficient documentation

## 2016-12-30 DIAGNOSIS — R51 Headache: Secondary | ICD-10-CM | POA: Diagnosis present

## 2016-12-30 NOTE — ED Notes (Signed)
Patient reports periods of head pressure and flushing when she is stressed out, going up stairs or singing. Patient reports it feels like "air is up there". Patient reports episodes have been lasting longer and today she had one that lasted approx 45 mins. States she also feels a change in her HR with stress.

## 2016-12-30 NOTE — ED Provider Notes (Signed)
[facility] Emergency Department Provider Note    First MD Initiated Contact with Patient 12/30/16 1839     (approximate)  I have reviewed the triage vital signs and the nursing notes.   HISTORY  Chief Complaint Headache (pressure)    HPI Korine Winton is a 31 y.o. female presents with several months of intermittent "hairiness "the top of her head that she frequently diagnosis when she is walking upstairs or a hill, singing in choir or when she gets very angry at her children.  She denies any numbness or tingling. No headaches at this time. No blurry vision. Patient is scheduled for outpatient workup with neurology but had an episode that lasted 40 minutes today so she called her primary care doctor who told her come to the ER for further evaluation. On arrival patient asymptomatic. States that she does do with quite a bit of stress. She denies any chest pain or shortness of breath. No abdominal pain. No chance of being pregnant.   Past Medical History:  Diagnosis Date  . Chest pain   . Chronic back pain   . Sciatica    Family History  Problem Relation Age of Onset  . Hypertension Father   . Diabetes Father   . Coronary artery disease Sister    Past Surgical History:  Procedure Laterality Date  . CHOLECYSTECTOMY     Patient Active Problem List   Diagnosis Date Noted  . Sciatica   . Chest pain   . Back pain 03/03/2013      Prior to Admission medications   Medication Sig Start Date End Date Taking? Authorizing Provider  Multiple Vitamin (MULTIVITAMIN WITH MINERALS) TABS tablet Take 1 tablet by mouth once a week.    [provider]    Allergies Shellfish allergy    Social History Social History  Substance Use Topics  . Smoking status: Former Smoker    Types: Cigarettes  . Smokeless tobacco: Never Used  . Alcohol use No    Review of Systems Patient denies headaches, rhinorrhea, blurry vision, numbness, shortness of  breath, chest pain, edema, cough, abdominal pain, nausea, vomiting, diarrhea, dysuria, fevers, rashes or hallucinations unless otherwise stated above in HPI. ____________________________________________   PHYSICAL EXAM:  VITAL SIGNS: Vitals:   12/30/16 1742  BP: (!) 117/100  Pulse: 79  Resp: 18  Temp: 98.9 F (37.2 C)    Constitutional: Alert and oriented. Well appearing and in no acute distress. Eyes: Conjunctivae are normal.  Head: Atraumatic. Nose: No congestion/rhinnorhea. Mouth/Throat: Mucous membranes are moist.   Neck: Painless ROM.  Cardiovascular:   Good peripheral circulation. Respiratory: Normal respiratory effort.  No retractions.  Gastrointestinal: Soft and nontender.  Musculoskeletal: No lower extremity tenderness .  No joint effusions. Neurologic:  CN- intact.  No facial droop, Normal FNF.  Normal heel to shin.  Sensation intact bilaterally. Normal speech and language. No gross focal neurologic deficits are appreciated. No gait instability.  Skin:  Skin is warm, dry and intact. No rash noted. Psychiatric: Mood and affect are normal. Speech and behavior are normal.  ____________________________________________   LABS (all labs ordered are listed, but only abnormal results are displayed)  No results found for this or any previous visit (from the past 24 hour(s)). ____________________________________________ ____________________________________________  [email or web address]  I personally reviewed all radiographic images ordered to evaluate for the above acute complaints and reviewed radiology reports and findings.  These findings were personally discussed with the patient.  Please see medical record for  radiology report.  ____________________________________________   PROCEDURES  Procedure(s) performed:  Procedures    Critical Care performed: no ____________________________________________   INITIAL IMPRESSION / ASSESSMENT AND PLAN / ED COURSE  Pertinent  labs & imaging results that were available during my care of the patient were reviewed by me and considered in my medical decision making (see chart for details).  DDX: stress, anxiety, tension, migraine, unlikely cva, seziures  Lively Haberman is a 31 y.o. who presents to the ED with "airiness" on the top of her head as described above. Patient with a reassuring neuro exam. She is afebrile and hemodynamically stable.  Symptoms of been going on and off for the past several months and seemed to be somewhat related to seeing as well as stress at home when she gets angry with her children. CT imaging ordered as the patient is very concerned that this could be related to bleed or tumor. CT imaging unremarkable. Patient remains hemodynamic stable feel that she is appropriate for further workup as an outpatient.  Have discussed with the patient and available family all diagnostics and treatments performed thus far and all questions were answered to the best of my ability. The patient demonstrates understanding and agreement with plan.       ____________________________________________   FINAL CLINICAL IMPRESSION(S) / ED DIAGNOSES  Final diagnoses:  Paresthesia      NEW MEDICATIONS STARTED DURING THIS VISIT:  New Prescriptions   No medications on file     Note:  This document was prepared using Dragon voice recognition software and may include unintentional dictation errors.     Merlyn Lot, MD 12/30/16 [phone]

## 2016-12-30 NOTE — ED Triage Notes (Signed)
Pt with headache/pressure for three mths, worse when she goes up the stairs or up a hill and when she sings at church.

## 2016-12-30 NOTE — Discharge Instructions (Signed)

## 2017-01-12 ENCOUNTER — Encounter: Payer: Self-pay | Admitting: Primary Care

## 2017-01-12 ENCOUNTER — Ambulatory Visit (INDEPENDENT_AMBULATORY_CARE_PROVIDER_SITE_OTHER): Admitting: Primary Care

## 2017-01-12 VITALS — BP 120/82 | HR 70 | Temp 98.2°F | Ht 63.0 in | Wt 215.8 lb

## 2017-01-12 DIAGNOSIS — R202 Paresthesia of skin: Secondary | ICD-10-CM | POA: Diagnosis not present

## 2017-01-12 DIAGNOSIS — R1084 Generalized abdominal pain: Secondary | ICD-10-CM | POA: Diagnosis not present

## 2017-01-12 LAB — COMPREHENSIVE METABOLIC PANEL
ALT: 21 U/L (ref 0–35)
AST: 17 U/L (ref 0–37)
Albumin: 4.2 g/dL (ref 3.5–5.2)
Alkaline Phosphatase: 65 U/L (ref 39–117)
BUN: 10 mg/dL (ref 6–23)
CALCIUM: 9.1 mg/dL (ref 8.4–10.5)
CHLORIDE: 105 meq/L (ref 96–112)
CO2: 27 meq/L (ref 19–32)
Creatinine, Ser: 0.55 mg/dL (ref 0.40–1.20)
GFR: 135.53 mL/min (ref >60.00)
Glucose, Bld: 100 mg/dL — ABNORMAL HIGH (ref 70–99)
Potassium: 3.8 mEq/L (ref 3.5–5.1)
Sodium: 138 mEq/L (ref 135–145)
Total Bilirubin: 0.5 mg/dL (ref 0.2–1.2)
Total Protein: 6.7 g/dL (ref 6.0–8.3)

## 2017-01-12 LAB — H. PYLORI ANTIBODY, IGG: H PYLORI IGG: NEGATIVE

## 2017-01-12 LAB — HEMOGLOBIN A1C: Hgb A1c MFr Bld: 5.5 % (ref 4.6–6.5)

## 2017-01-12 NOTE — Patient Instructions (Addendum)
Complete lab work prior to leaving today. I will notify you of your results once received.   Stop by the front desk and speak with either Rosaria Ferries or Shirlean Mylar regarding your ultrasound. I will be in touch with you once I receive these results.  It was a pleasure to meet you today! Please don't hesitate to call me with any questions. Welcome to [facility]!  Irritable Bowel Syndrome, Adult Irritable bowel syndrome (IBS) is not one specific disease. It is a group of symptoms that affects the organs responsible for digestion (gastrointestinal or GI tract). To regulate how your GI tract works, your body sends signals back and forth between your intestines and your brain. If you have IBS, there may be a problem with these signals. As a result, your GI tract does not function normally. Your intestines may become more sensitive and overreact to certain things. This is especially true when you eat certain foods or when you are under stress. There are four types of IBS. These may be determined based on the consistency of your stool:  IBS with diarrhea.  IBS with constipation.  Mixed IBS.  Unsubtyped IBS.  It is important to know which type of IBS you have. Some treatments are more likely to be helpful for certain types of IBS. What are the causes? The exact cause of IBS is not known. What increases the risk? You may have a higher risk of IBS if:  You are a woman.  You are younger than 33 years old.  You have a family history of IBS.  You have mental health problems.  You have had bacterial infection of your GI tract.  What are the signs or symptoms? Symptoms of IBS vary from person to person. The main symptom is abdominal pain or discomfort. Additional symptoms usually include one or more of the following:  Diarrhea, constipation, or both.  Abdominal swelling or bloating.  Feeling full or sick after eating a small or regular-size meal.  Frequent gas.  Mucus in the stool.  A feeling of  having more stool left after a bowel movement.  Symptoms tend to come and go. They may be associated with stress, psychiatric conditions, or nothing at all. How is this diagnosed? There is no specific test to diagnose IBS. Your health care provider will make a diagnosis based on a physical exam, medical history, and your symptoms. You may have other tests to rule out other conditions that may be causing your symptoms. These may include:  Blood tests.  X-rays.  CT scan.  Endoscopy and colonoscopy. This is a test in which your GI tract is viewed with a long, thin, flexible tube.  How is this treated? There is no cure for IBS, but treatment can help relieve symptoms. IBS treatment often includes:  Changes to your diet, such as: ? Eating more fiber. ? Avoiding foods that cause symptoms. ? Drinking more water. ? Eating regular, medium-sized portioned meals.  Medicines. These may include: ? Fiber supplements if you have constipation. ? Medicine to control diarrhea (antidiarrheal medicines). ? Medicine to help control muscle spasms in your GI tract (antispasmodic medicines). ? Medicines to help with any mental health issues, such as antidepressants or tranquilizers.  Therapy. ? Talk therapy may help with anxiety, depression, or other mental health issues that can make IBS symptoms worse.  Stress reduction. ? Managing your stress can help keep symptoms under control.  Follow these instructions at home:  Take medicines only as directed by your health care  provider.  Eat a healthy diet. ? Avoid foods and drinks with added sugar. ? Include more whole grains, fruits, and vegetables gradually into your diet. This may be especially helpful if you have IBS with constipation. ? Avoid any foods and drinks that make your symptoms worse. These may include dairy products and caffeinated or carbonated drinks. ? Do not eat large meals. ? Drink enough fluid to keep your urine clear or pale  yellow.  Exercise regularly. Ask your health care provider for recommendations of good activities for you.  Keep all follow-up visits as directed by your health care provider. This is important. Contact a health care provider if:  You have constant pain.  You have trouble or pain with swallowing.  You have worsening diarrhea. Get help right away if:  You have severe and worsening abdominal pain.  You have diarrhea and: ? You have a rash, stiff neck, or severe headache. ? You are irritable, sleepy, or difficult to awaken. ? You are weak, dizzy, or extremely thirsty.  You have bright red blood in your stool or you have black tarry stools.  You have unusual abdominal swelling that is painful.  You vomit continuously.  You vomit blood (hematemesis).  You have both abdominal pain and a fever. This information is not intended to replace advice given to you by your health care provider. Make sure you discuss any questions you have with your health care provider. Document Released: 05/19/2005 Document Revised: 10/19/2015 Document Reviewed: 02/03/2014 Elsevier Interactive Patient Education  2018 [employer].   Diet for Irritable Bowel Syndrome When you have irritable bowel syndrome (IBS), the foods you eat and your eating habits are very important. IBS may cause various symptoms, such as abdominal pain, constipation, or diarrhea. Choosing the right foods can help ease discomfort caused by these symptoms. Work with your health care provider and dietitian to find the best eating plan to help control your symptoms. What general guidelines do I need to follow?  Keep a food diary. This will help you identify foods that cause symptoms. Write down: ? What you eat and when. ? What symptoms you have. ? When symptoms occur in relation to your meals.  Avoid foods that cause symptoms. Talk with your dietitian about other ways to get the same nutrients that are in these foods.  Eat more  foods that contain fiber. Take a fiber supplement if directed by your dietitian.  Eat your meals slowly, in a relaxed setting.  Aim to eat 5-6 small meals per day. Do not skip meals.  Drink enough fluids to keep your urine clear or pale yellow.  Ask your health care provider if you should take an over-the-counter probiotic during flare-ups to help restore healthy gut bacteria.  If you have cramping or diarrhea, try making your meals low in fat and high in carbohydrates. Examples of carbohydrates are pasta, rice, whole grain breads and cereals, fruits, and vegetables.  If dairy products cause your symptoms to flare up, try eating less of them. You might be able to handle yogurt better than other dairy products because it contains bacteria that help with digestion. What foods are not recommended? The following are some foods and drinks that may worsen your symptoms:  Fatty foods, such as [place] fries.  Milk products, such as cheese or ice cream.  Chocolate.  Alcohol.  Products with caffeine, such as coffee.  Carbonated drinks, such as soda.  The items listed above may not be a complete list of  foods and beverages to avoid. Contact your dietitian for more information. What foods are good sources of fiber? Your health care provider or dietitian may recommend that you eat more foods that contain fiber. Fiber can help reduce constipation and other IBS symptoms. Add foods with fiber to your diet a little at a time so that your body can get used to them. Too much fiber at once might cause gas and swelling of your abdomen. The following are some foods that are good sources of fiber:  Apples.  Peaches.  Pears.  Berries.  Figs.  Broccoli (raw).  Cabbage.  Carrots.  Raw peas.  Kidney beans.  Lima beans.  Whole grain bread.  Whole grain cereal.  Where to find more information: [employer] for Functional Gastrointestinal Disorders: www.iffgd.[employer] of Diabetes and Digestive and Kidney Diseases: NetworkAffair.co.za.aspx This information is not intended to replace advice given to you by your health care provider. Make sure you discuss any questions you have with your health care provider. Document Released: 08/09/2003 Document Revised: 10/25/2015 Document Reviewed: 08/19/2013 Elsevier Interactive Patient Education  2018 [employer].

## 2017-01-12 NOTE — Assessment & Plan Note (Addendum)
Emergency department visit reviewed.  Symptoms sound like IBS, exam today unremarkable. Doesn't seem to be GERD given diarrhea. Check [place] of abdomen to rule out any other cause. Also check labs today including Celiac panel, CMP, A1C, H. Pylori. Discussed to start keeping a consistent food journal, avoid spicy/irritative foods; handout provided today.   She kindly declines a trial of oral medication. Consider nutritionist referral if ultrasound unremarkable.

## 2017-01-12 NOTE — Progress Notes (Signed)
Subjective:    Patient ID: Deborah Nichols, female    DOB: [birth year], 31 y.o.   MRN: [id number]  HPI  Deborah Nichols is a 31 year old female who presents today to establish care and discuss the problems mentioned below. Will obtain old records.  1) Paraesthesias: Evaluated on 12/30/16 with a chief complaint of paraesthesias and "airness" to the top of her head that has been intermittent for several months. She underwent work-up in the emergency department including CT head and labs, this was unremarkable. Her symptoms were considered secondary to stress, anxiety, headaches/migraines. She has an appointment with a neurologist soon.   2) Abdominal Pain: Evaluated on 11/26/16 with a chief complaint of weakness with abdominal pain, diarrhea (watery without mucous or blood), nausea. She was diagnosed with viral gastroenteritis given unremarkable work up. She was treated with IV fluids, Zofran, and oral loperamide. She did end up leaving the emergency department before treatment was completed.   Symptoms present for months. Experiences diarrhea most every time after she eats. She's tried keeping a food diary and has noticed aggravation to morning consumption of milk (not afternoon), sugar, artifical flavors. She cooks mostly rice, beans, chicken at home. If she eats this freshly prepared she has no symptoms, but if she eats leftovers of this same food then she will experience symptoms. The only foods she can eat without symptoms is chicken breast and broccoli. Sometimes she will have to "starve" herself to not have her symptoms. Her abdominal pain is generally in the upper and mid abdomen bilaterally. She's not taken anything for her symptoms.   Diet currently consists of:  Breakfast: Oatmeal puree, organic cereal (oats), lactose free/2% regular milk Lunch: Sandwiches (ham and cheese) Dinner: Rice, beans, chicken (grilled with seasonings), steak, salad, steamed broccoli, cauliflower, asparagus, canned  veggies. Snacks: Granola bars (glute free), cakes Desserts: Cookies, cake, ice cream, jello, candy (daily). Beverages: Water, fit and active powder, occasional soda with restaurants   Exercise: She is not currently exercising   Review of Systems  Constitutional: Negative for fever.  HENT: Negative for congestion.   Eyes: Negative for visual disturbance.  Respiratory: Negative for shortness of breath.   Cardiovascular: Negative for chest pain.  Gastrointestinal: Positive for abdominal pain and diarrhea. Negative for constipation, nausea and vomiting.  Genitourinary: Negative for menstrual problem.  Skin: Negative for color change.  Neurological: Positive for headaches.       See HPI  Psychiatric/Behavioral:       Intermittent anxiety and stress with her children.       Past Medical History:  Diagnosis Date  . Chest pain   . Chronic back pain   . Sciatica      Social History   Social History  . Marital status: Married    Spouse name: N/A  . Number of children: N/A  . Years of education: N/A   Occupational History  . Not on file.   Social History Main Topics  . Smoking status: Former Smoker    Types: Cigarettes  . Smokeless tobacco: Never Used  . Alcohol use No  . Drug use: No  . Sexual activity: Yes    Birth control/ protection: None   Other Topics Concern  . Not on file   Social History Narrative  . No narrative on file    Past Surgical History:  Procedure Laterality Date  . CHOLECYSTECTOMY      Family History  Problem Relation Age of Onset  . Hypertension Father   .  Diabetes Father   . Coronary artery disease Sister     Allergies  Allergen Reactions  . Shellfish Allergy Nausea And Vomiting and Rash    Current Outpatient Prescriptions on File Prior to Visit  Medication Sig Dispense Refill  . Multiple Vitamin (MULTIVITAMIN WITH MINERALS) TABS tablet Take 1 tablet by mouth once a week.     No current facility-administered medications on file  prior to visit.     BP 120/82   Pulse 70   Temp 98.2 F (36.8 C) (Oral)   Ht 5\' 3"  (1.6 m)   Wt 215 lb 12.8 oz (97.9 kg)   LMP 12/16/2016   SpO2 97%   BMI 38.23 kg/m    Objective:   Physical Exam  Constitutional: She is oriented to person, place, and time. She appears well-nourished. She does not appear ill.  Neck: Neck supple.  Cardiovascular: Normal rate and regular rhythm.   Pulmonary/Chest: Effort normal and breath sounds normal.  Abdominal: Soft. Bowel sounds are normal. There is no tenderness.  Neurological: She is alert and oriented to person, place, and time.  Skin: Skin is warm and dry.  Psychiatric: She has a normal mood and affect.          Assessment & Plan:

## 2017-01-12 NOTE — Assessment & Plan Note (Signed)
Emergency department visit reviewed. She will be seeing neurology soon.

## 2017-01-18 LAB — CELIAC PNL 2 RFLX ENDOMYSIAL AB TTR
Endomysial Ab IgA: NEGATIVE
GLIADIN(DEAM) AB,IGA: 3 U (ref <20)
GLIADIN(DEAM) AB,IGG: 4 U (ref <20)
IMMUNOGLOBULIN A: 160 mg/dL (ref 81–463)

## 2017-01-19 ENCOUNTER — Other Ambulatory Visit

## 2017-01-20 ENCOUNTER — Other Ambulatory Visit: Payer: Self-pay | Admitting: Primary Care

## 2017-01-20 DIAGNOSIS — R1084 Generalized abdominal pain: Secondary | ICD-10-CM

## 2017-01-22 ENCOUNTER — Encounter: Payer: Self-pay | Admitting: Gastroenterology

## 2017-02-23 ENCOUNTER — Ambulatory Visit: Admitting: Neurology

## 2017-02-24 ENCOUNTER — Ambulatory Visit: Admitting: Neurology

## 2017-02-25 ENCOUNTER — Encounter: Payer: Self-pay | Admitting: Neurology

## 2018-06-04 ENCOUNTER — Emergency Department (HOSPITAL_COMMUNITY)
Admission: EM | Admit: 2018-06-04 | Discharge: 2018-06-04 | Disposition: A | Discharge status: Home / Self Care | Attending: Emergency Medicine | Admitting: Emergency Medicine

## 2018-06-04 ENCOUNTER — Emergency Department (HOSPITAL_COMMUNITY)

## 2018-06-04 ENCOUNTER — Telehealth: Payer: Self-pay

## 2018-06-04 ENCOUNTER — Other Ambulatory Visit: Payer: Self-pay

## 2018-06-04 DIAGNOSIS — Z87891 Personal history of nicotine dependence: Secondary | ICD-10-CM | POA: Diagnosis not present

## 2018-06-04 DIAGNOSIS — M2550 Pain in unspecified joint: Secondary | ICD-10-CM | POA: Insufficient documentation

## 2018-06-04 DIAGNOSIS — R202 Paresthesia of skin: Secondary | ICD-10-CM

## 2018-06-04 DIAGNOSIS — Z79899 Other long term (current) drug therapy: Secondary | ICD-10-CM | POA: Diagnosis not present

## 2018-06-04 DIAGNOSIS — R200 Anesthesia of skin: Secondary | ICD-10-CM | POA: Insufficient documentation

## 2018-06-04 LAB — URINALYSIS, ROUTINE W REFLEX MICROSCOPIC
BILIRUBIN URINE: NEGATIVE
Glucose, UA: NEGATIVE mg/dL
Ketones, ur: NEGATIVE mg/dL
Nitrite: NEGATIVE
PH: 6 (ref 5.0–8.0)
Protein, ur: NEGATIVE mg/dL
SPECIFIC GRAVITY, URINE: 1.006 (ref 1.005–1.030)

## 2018-06-04 LAB — CBC WITH DIFFERENTIAL/PLATELET
ABS IMMATURE GRANULOCYTES: 0.02 10*3/uL (ref 0.00–0.07)
BASOS PCT: 1 %
Basophils Absolute: 0 10*3/uL (ref 0.0–0.1)
EOS ABS: 0.1 10*3/uL (ref 0.0–0.5)
Eosinophils Relative: 1 %
HCT: 45.1 % (ref 36.0–46.0)
Hemoglobin: 15.1 g/dL — ABNORMAL HIGH (ref 12.0–15.0)
Immature Granulocytes: 0 %
Lymphocytes Relative: 32 %
Lymphs Abs: 2.5 10*3/uL (ref 0.7–4.0)
MCH: 29.2 pg (ref 26.0–34.0)
MCHC: 33.5 g/dL (ref 30.0–36.0)
MCV: 87.1 fL (ref 80.0–100.0)
Monocytes Absolute: 0.4 10*3/uL (ref 0.1–1.0)
Monocytes Relative: 5 %
NEUTROS ABS: 4.7 10*3/uL (ref 1.7–7.7)
NEUTROS PCT: 61 %
PLATELETS: 311 10*3/uL (ref 150–400)
RBC: 5.18 MIL/uL — AB (ref 3.87–5.11)
RDW: 12.2 % (ref 11.5–15.5)
WBC: 7.8 10*3/uL (ref 4.0–10.5)
nRBC: 0 % (ref 0.0–0.2)

## 2018-06-04 LAB — BASIC METABOLIC PANEL
ANION GAP: 8 (ref 5–15)
BUN: 7 mg/dL (ref 6–20)
CALCIUM: 9.1 mg/dL (ref 8.9–10.3)
CO2: 27 mmol/L (ref 22–32)
Chloride: 104 mmol/L (ref 98–111)
Creatinine, Ser: 0.69 mg/dL (ref 0.44–1.00)
Glucose, Bld: 101 mg/dL — ABNORMAL HIGH (ref 70–99)
POTASSIUM: 3.7 mmol/L (ref 3.5–5.1)
SODIUM: 139 mmol/L (ref 135–145)

## 2018-06-04 LAB — FOLATE: Folate: 20.5 ng/mL (ref >5.9)

## 2018-06-04 LAB — CK: Total CK: 57 U/L (ref 38–234)

## 2018-06-04 LAB — C-REACTIVE PROTEIN

## 2018-06-04 LAB — VITAMIN B12: VITAMIN B 12: 246 pg/mL (ref 180–914)

## 2018-06-04 LAB — SEDIMENTATION RATE: SED RATE: 13 mm/h (ref 0–22)

## 2018-06-04 LAB — PREGNANCY, URINE: PREG TEST UR: NEGATIVE

## 2018-06-04 NOTE — ED Triage Notes (Signed)
Pt here from home with c/o general weakness and numbness and "bone pain " for 4 to 5 months

## 2018-06-04 NOTE — Telephone Encounter (Signed)
This can very well be handled in the outpatient care setting.  I do see where she has been triaged at [facility] emergency department so we will let her proceed.    Please call patient and have her scheduled for follow-up with me at her convenience.  Try to put her at the end of the session if possible.

## 2018-06-04 NOTE — Discharge Instructions (Addendum)
You were seen in the emergency department for bone pain joint aches and numbness and weakness.  You had multiple blood test that did not show an obvious cause of your symptoms.  There is still some blood tests pending and your primary care doctor will need to follow-up with this.  Please follow-up with them as scheduled.  Return if any concerns.

## 2018-06-04 NOTE — Telephone Encounter (Signed)
Pt last seen 01/12/17. Pt said for 2 yrs having problems with bone pain and numbness in extremities but has worsened last couple of weeks. Pt has numbness in both arms, the right hand is numb but also hurting. Pt having trouble sleeping due to bone pain all over body. Both legs are also numb feeling and cramping. Pt feels like weakness in bones in ankles. Pt having problems with difficulty holding a cup or gripping anything; weakness in rt hand.pt also has H/A , dizzy and nausea. Pt wonders if could be arthritis or problem with Vit D. I advised pt needed eval done could also be possible symptoms of a stroke. No available appts at any [facility] site. Pt will go to UC or ED, pt said she will go to [facility] ED. FYI to Gentry Fitz NP.

## 2018-06-04 NOTE — ED Notes (Signed)
Patient verbalized understanding of discharge instructions and denies any further needs or questions at this time. VS stable. Patient ambulatory with steady gait.  

## 2018-06-04 NOTE — ED Provider Notes (Signed)
[facility] EMERGENCY DEPARTMENT Provider Note   CSN: [id number] Arrival date & time: 06/04/18  1206     History   Chief Complaint Chief Complaint  Patient presents with  . Numbness    HPI Deborah Nichols is a 33 y.o. female.  She presents to the emergency department with complaint of pain in her bones that have been going on for almost a year.  She says it is progressively worse.  She says it started in her ankles and now it is also in her knees her wrists or shoulders.  It makes her feel weak all over and she feels like she might collapse when she is walking sometimes.  She has had some intermittent numbness and both her arms more right than left, over the last few days.  She says her arm feels weak.  She gets intermittent paresthesias.  She is got a low-grade headache.  She said she called her doctor today and they told her she come to the emergency department.  She is on no medications and she is taking some vitamin supplements.  The history is provided by the patient.  Illness  This is a chronic problem. Episode onset: 1 year. The problem occurs daily. The problem has been gradually worsening. Associated symptoms include headaches. Pertinent negatives include no chest pain, no abdominal pain and no shortness of breath. The symptoms are aggravated by exertion. Nothing relieves the symptoms. She has tried rest for the symptoms. The treatment provided no relief.    Past Medical History:  Diagnosis Date  . Chest pain   . Chronic back pain   . Sciatica     Patient Active Problem List   Diagnosis Date Noted  . Generalized abdominal pain 01/12/2017  . Paresthesias 01/12/2017  . Sciatica   . Chest pain   . Back pain 03/03/2013    Past Surgical History:  Procedure Laterality Date  . CHOLECYSTECTOMY       OB History   No obstetric history on file.      Home Medications    Prior to Admission medications   Medication Sig Start Date End Date Taking?  Authorizing Provider  Multiple Vitamin (MULTIVITAMIN WITH MINERALS) TABS tablet Take 1 tablet by mouth once a week.    [provider]    Family History Family History  Problem Relation Age of Onset  . Hypertension Father   . Diabetes Father   . Coronary artery disease Sister     Social History Social History   Tobacco Use  . Smoking status: Former Smoker    Types: Cigarettes  . Smokeless tobacco: Never Used  Substance Use Topics  . Alcohol use: No  . Drug use: No     Allergies   Shellfish allergy   Review of Systems Review of Systems  Constitutional: Negative for fever.  HENT: Negative for sore throat.   Eyes: Negative for visual disturbance.  Respiratory: Negative for shortness of breath.   Cardiovascular: Negative for chest pain.  Gastrointestinal: Negative for abdominal pain.  Genitourinary: Negative for dysuria.  Musculoskeletal: Positive for arthralgias, back pain, joint swelling and myalgias.  Skin: Negative for rash.  Neurological: Positive for headaches.     Physical Exam Updated Vital Signs BP 110/62 (BP Location: Left Arm)   Pulse 68   Temp 98.3 F (36.8 C) (Oral)   Resp 16   SpO2 97%   Physical Exam Vitals signs and nursing note reviewed.  Constitutional:      General:  She is not in acute distress.    Appearance: She is well-developed.  HENT:     Head: Normocephalic and atraumatic.  Eyes:     Conjunctiva/sclera: Conjunctivae normal.  Neck:     Musculoskeletal: Neck supple.  Cardiovascular:     Rate and Rhythm: Normal rate and regular rhythm.     Heart sounds: No murmur.  Pulmonary:     Effort: Pulmonary effort is normal. No respiratory distress.     Breath sounds: Normal breath sounds.  Abdominal:     Palpations: Abdomen is soft.     Tenderness: There is no abdominal tenderness.  Musculoskeletal: Normal range of motion.        General: Tenderness present. No signs of injury.     Right lower leg: No edema.     Left lower  leg: No edema.  Skin:    General: Skin is warm and dry.     Capillary Refill: Capillary refill takes less than 2 seconds.  Neurological:     General: No focal deficit present.     Mental Status: She is alert and oriented to person, place, and time.     Motor: No weakness.     Gait: Gait normal.      ED Treatments / Results  Labs (all labs ordered are listed, but only abnormal results are displayed) Labs Reviewed  BASIC METABOLIC PANEL - Abnormal; Notable for the following components:      Result Value   Glucose, Bld 101 (*)    All other components within normal limits  CBC WITH DIFFERENTIAL/PLATELET - Abnormal; Notable for the following components:   RBC 5.18 (*)    Hemoglobin 15.1 (*)    All other components within normal limits  URINALYSIS, ROUTINE W REFLEX MICROSCOPIC - Abnormal; Notable for the following components:   Hgb urine dipstick SMALL (*)    Leukocytes, UA SMALL (*)    Bacteria, UA RARE (*)    All other components within normal limits  RHEUMATOID FACTOR  SEDIMENTATION RATE  PREGNANCY, URINE  C-REACTIVE PROTEIN  VITAMIN B12  FOLATE  CK  LUPUS ANTICOAGULANT PANEL    EKG None  Radiology Ct Head Wo Contrast  Result Date: 06/04/2018 CLINICAL DATA:  Ct head wo, Pt here from home with c/o general weakness and numbness and "bone pain " for 4 to 5 months EXAM: CT HEAD WITHOUT CONTRAST TECHNIQUE: Contiguous axial images were obtained from the base of the skull through the vertex without intravenous contrast. COMPARISON:  12/30/2016 FINDINGS: Brain: No evidence of acute infarction, hemorrhage, hydrocephalus, extra-axial collection or mass lesion/mass effect. Vascular: No hyperdense vessel or unexpected calcification. Skull: Normal. Negative for fracture or focal lesion. Sinuses/Orbits: Normal globes and orbits. The visualized sinuses and mastoid air cells are clear. Other: None. IMPRESSION: Normal unenhanced CT scan of the brain. Electronically Signed   By: Lajean Manes  M.D.   On: 06/04/2018 14:35    Procedures Procedures (including critical care time)  Medications Ordered in ED Medications - No data to display   Initial Impression / Assessment and Plan / ED Course  I have reviewed the triage vital signs and the nursing notes.  Pertinent labs & imaging results that were available during my care of the patient were reviewed by me and considered in my medical decision making (see chart for details).  Clinical Course as of Jun 06 1303  Fri Jun 04, 2018  1546 Patient's lab work so far has been unremarkable.  There are some other  tests still pending and I explained this to her.  She is made appointments for her doctors coming up this week so I explained that they will be able to follow-up in her tests and proceed with continued work-up from there.  She is comfortable with explanation.   [MB]    Clinical Course User Index [MB] Hayden Rasmussen, MD     Final Clinical Impressions(s) / ED Diagnoses   Final diagnoses:  Arthralgia of multiple joints  Paresthesias    ED Discharge Orders    None       Hayden Rasmussen, MD 06/05/18 1306

## 2018-06-05 LAB — RHEUMATOID FACTOR

## 2018-06-07 LAB — LUPUS ANTICOAGULANT PANEL
DRVVT: 34.4 s (ref 0.0–47.0)
PTT LA: 33.2 s (ref 0.0–51.9)

## 2018-06-09 NOTE — Telephone Encounter (Signed)
Pt has ED FU appt on 06/10/18.

## 2018-06-10 ENCOUNTER — Encounter

## 2018-06-10 ENCOUNTER — Ambulatory Visit: Admitting: Primary Care

## 2018-06-11 ENCOUNTER — Telehealth: Payer: Self-pay | Admitting: Primary Care

## 2018-06-11 NOTE — Telephone Encounter (Signed)
Yes, please waive no show. Sorry to hear about that. We will see her at her convenience.

## 2018-06-11 NOTE — Telephone Encounter (Signed)
Pt called to confirm her appointment.  She stated she was told Friday 06/10/2018 the reminder said Thursday 06/10/2018.  She was going to call to confirm date but she had to go out of town for death in family yesterday.  Can no show charge be wavied.

## 2018-06-14 ENCOUNTER — Ambulatory Visit (INDEPENDENT_AMBULATORY_CARE_PROVIDER_SITE_OTHER): Admitting: Primary Care

## 2018-06-14 ENCOUNTER — Encounter: Payer: Self-pay | Admitting: Primary Care

## 2018-06-14 VITALS — BP 122/82 | HR 77 | Temp 98.2°F | Ht 65.0 in | Wt 216.8 lb

## 2018-06-14 DIAGNOSIS — E559 Vitamin D deficiency, unspecified: Secondary | ICD-10-CM | POA: Diagnosis not present

## 2018-06-14 DIAGNOSIS — M2550 Pain in unspecified joint: Secondary | ICD-10-CM | POA: Insufficient documentation

## 2018-06-14 LAB — VITAMIN D 25 HYDROXY (VIT D DEFICIENCY, FRACTURES): VITD: 13.35 ng/mL — AB (ref 30.00–100.00)

## 2018-06-14 NOTE — Assessment & Plan Note (Signed)
Chronic for years, worse over the last one year. Extensive work up completed in the ED, grossly unremarkable. Will have her start vitamin B12 1000 mcg tablets daily. Check vitamin D. Follow up with rheumatology as referred.  Hospital notes and labs reviewed.

## 2018-06-14 NOTE — Telephone Encounter (Signed)
I updated from no show to cancelled.

## 2018-06-14 NOTE — Progress Notes (Signed)
Subjective:    Patient ID: Deborah Nichols, female    DOB: 21-Apr-1986, 33 y.o.   MRN: [id number]  HPI  Deborah Nichols, Deborah Nichols is a 33 year old female who presents today for emergency department follow up.  She presented to [facility] on 06/04/17 with a chief complaint of arthralgias. Symptoms have been present for nearly one year which has progressed. She also endorsed feeling weak as though she may collapse when walking at times. She's also noticed intermittent paresthesias, headaches.   During her stay in the ED she underwent lab work including CK, Folate, Vitamin B 12, rheumatoid arthritis labs, lupus labs. Vitamin B 12 was 246. Lupus and Rheumatoid Factor were negative. Other labs were grossly unremarkable. She was discharged home with recommendations to follow up with PCP.  BP Readings from Last 3 Encounters:  06/14/18 122/82  06/04/18 117/71  01/12/17 120/82   Today she's reporting chronic bilateral lower back pain, endorses a history of right sided sciatica for the last three years. Two years ago she was diagnosed with vitamin D deficiency, never really took vitamin D on a regular basis. Over the last several years she's had chronic pain to the bilateral knees, fingers, ankles, wrists. She mostly notices her symptoms when doing household chores. She does at times feel as though her legs will "give out" after she's been walking or cleaning.   She was referred to [facility] by the emergency department, is waiting on the appointment to be scheduled. She's been taking a Women's multivitamin intermittently for the last several months. She denies trauma/injury, joint swelling.   Review of Systems  Respiratory: Negative for shortness of breath.   Cardiovascular: Negative for chest pain.  Musculoskeletal: Positive for arthralgias, back pain and joint swelling.  Neurological: Negative for dizziness and headaches.       Past Medical History:  Diagnosis Date  . Chest pain   . Chronic back  pain   . Sciatica      Social History   Socioeconomic History  . Marital status: Married    Spouse name: Not on file  . Number of children: Not on file  . Years of education: Not on file  . Highest education level: Not on file  Occupational History  . Not on file  Social Needs  . Financial resource strain: Not on file  . Food insecurity:    Worry: Not on file    Inability: Not on file  . Transportation needs:    Medical: Not on file    Non-medical: Not on file  Tobacco Use  . Smoking status: Former Smoker    Types: Cigarettes  . Smokeless tobacco: Never Used  Substance and Sexual Activity  . Alcohol use: No  . Drug use: No  . Sexual activity: Yes    Birth control/protection: None  Lifestyle  . Physical activity:    Days per week: Not on file    Minutes per session: Not on file  . Stress: Not on file  Relationships  . Social connections:    Talks on phone: Not on file    Gets together: Not on file    Attends religious service: Not on file    Active member of club or organization: Not on file    Attends meetings of clubs or organizations: Not on file    Relationship status: Not on file  . Intimate partner violence:    Fear of current or ex partner: Not on file    Emotionally abused: Not  on file    Physically abused: Not on file    Forced sexual activity: Not on file  Other Topics Concern  . Not on file  Social History Narrative  . Not on file    Past Surgical History:  Procedure Laterality Date  . CHOLECYSTECTOMY      Family History  Problem Relation Age of Onset  . Hypertension Father   . Diabetes Father   . Coronary artery disease Sister     Allergies  Allergen Reactions  . Shellfish Allergy Nausea And Vomiting and Rash    Current Outpatient Medications on File Prior to Visit  Medication Sig Dispense Refill  . Multiple Vitamin (MULTIVITAMIN WITH MINERALS) TABS tablet Take 1 tablet by mouth once a week.     No current facility-administered  medications on file prior to visit.     BP 122/82   Pulse 77   Temp 98.2 F (36.8 C) (Oral)   Ht 5\' 5"  (1.651 m)   Wt 216 lb 12 oz (98.3 kg)   LMP 05/14/2018   SpO2 98%   BMI 36.07 kg/m    Objective:   Physical Exam  Constitutional: She is oriented to person, place, and time. She appears well-nourished.  Neck: Neck supple.  Cardiovascular: Normal rate and regular rhythm.  Respiratory: Effort normal and breath sounds normal.  Musculoskeletal: Normal range of motion.     Comments: 5/5 strength to bilateral upper and lower extremities. No joint swelling or skin color changes. Ambulates well in the office today.   Neurological: She is alert and oriented to person, place, and time.  Skin: Skin is warm and dry. No erythema.  Psychiatric: She has a normal mood and affect.           Assessment & Plan:

## 2018-06-14 NOTE — Patient Instructions (Signed)
Stop by the lab prior to leaving today. I will notify you of your results once received.   Start vitamin B12 1000 mcg once daily.  I will be in touch regarding how much Vitamin D to take. I will need to wait for your lab test to return.  Follow up with the rheumatologist as discussed.  It was a pleasure to see you today!

## 2018-06-15 ENCOUNTER — Other Ambulatory Visit: Payer: Self-pay | Admitting: Primary Care

## 2018-06-15 DIAGNOSIS — E559 Vitamin D deficiency, unspecified: Secondary | ICD-10-CM

## 2018-06-15 MED ORDER — VITAMIN D (ERGOCALCIFEROL) 1.25 MG (50000 UNIT) PO CAPS: ORAL_CAPSULE | ORAL | 0 refills | Status: DC

## 2018-06-15 NOTE — Assessment & Plan Note (Signed)
Noted on recent labs. Prescription for vitamin D 50,000 unit capsule sent to pharmacy for once weekly dosing. Repeat vitamin D in 3 months.

## 2018-06-16 MED ORDER — VITAMIN D (ERGOCALCIFEROL) 1.25 MG (50000 UNIT) PO CAPS: ORAL_CAPSULE | ORAL | 0 refills | Status: DC

## 2018-09-23 ENCOUNTER — Other Ambulatory Visit

## 2019-08-17 IMAGING — CT CT HEAD W/O CM
3 series · 16 of 47 positions shown, 19 images · non-contrast
Comparison: 12/30/2016

CLINICAL DATA: Ct head wo, Pt here from home with c/o general
weakness and numbness and "bone pain " for 4 to 5 months

EXAM:
CT HEAD WITHOUT CONTRAST
TECHNIQUE: Contiguous axial images were obtained from the base of the skull
through the vertex without intravenous contrast.

[Series 3: head 5.0 h30s · axial · 0.42mm/px · z∈[-138,-3]mm · 10 of 33 slices shown, 13 images]
[im 3/33  brain]
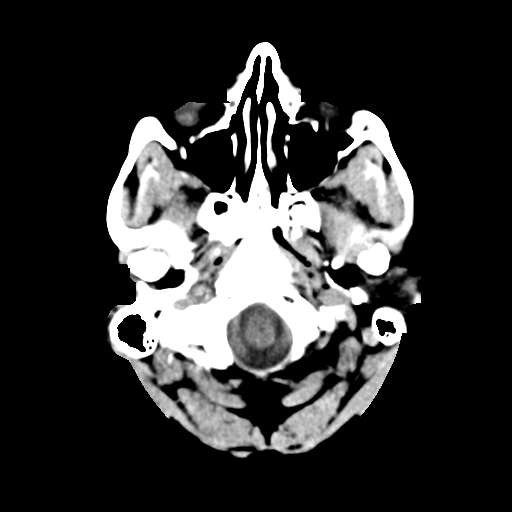
[im 3/33  bone]
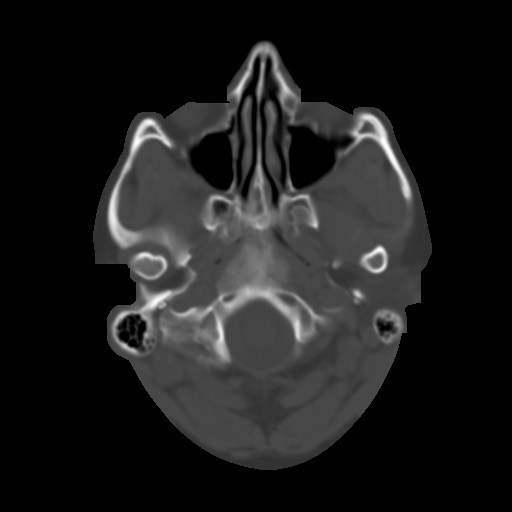
[im 6/33  brain]
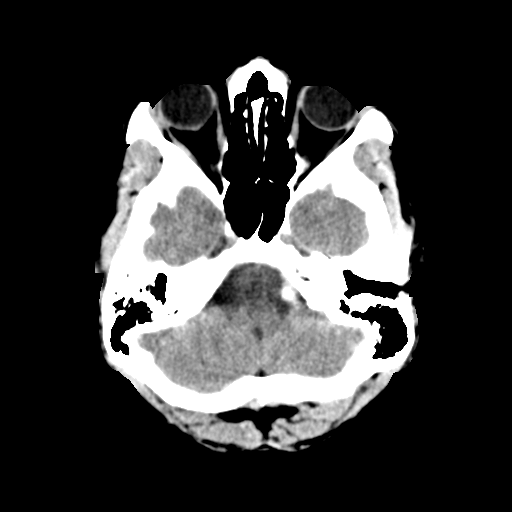
[im 9/33  brain]
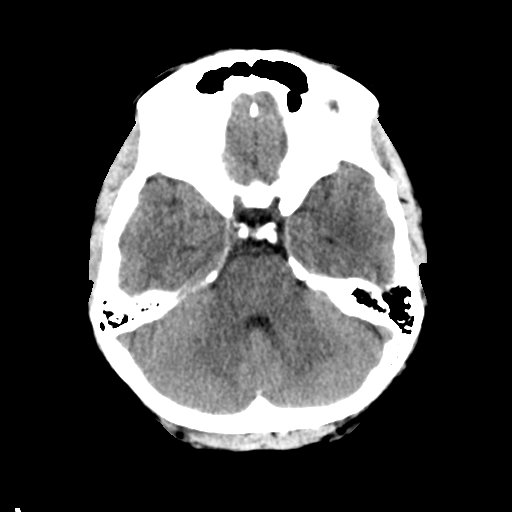
[im 12/33  brain]
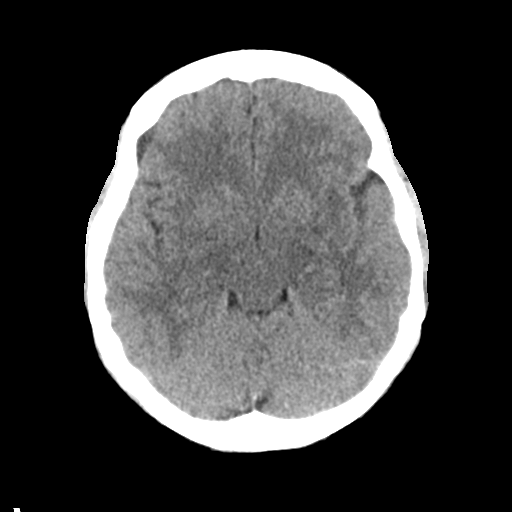
[im 15/33  brain]
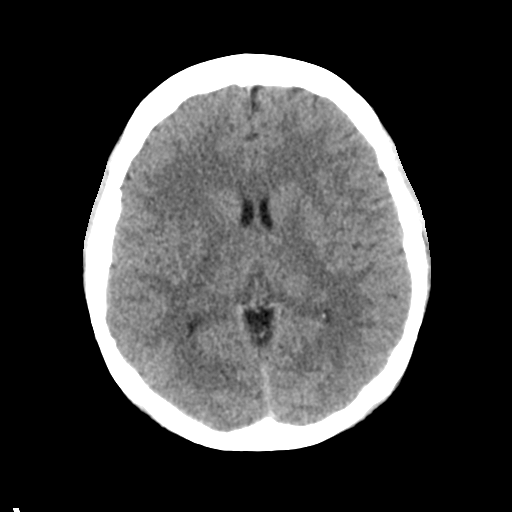
[im 15/33  bone]
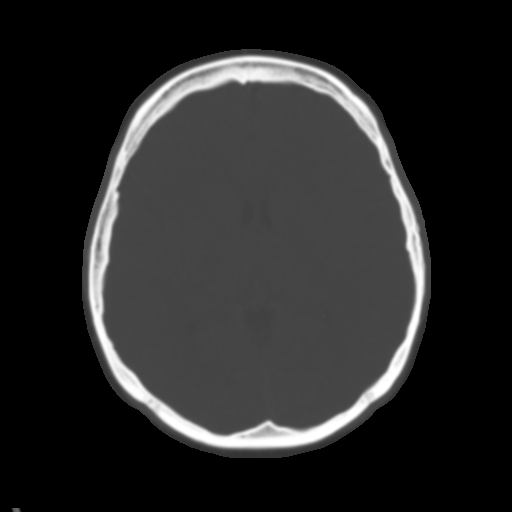
[im 18/33  brain]
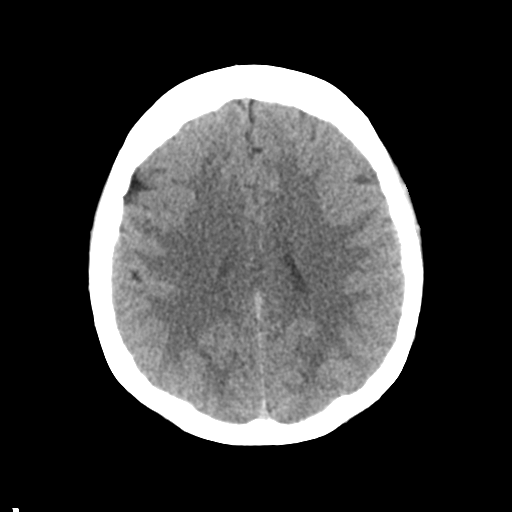
[im 21/33  brain]
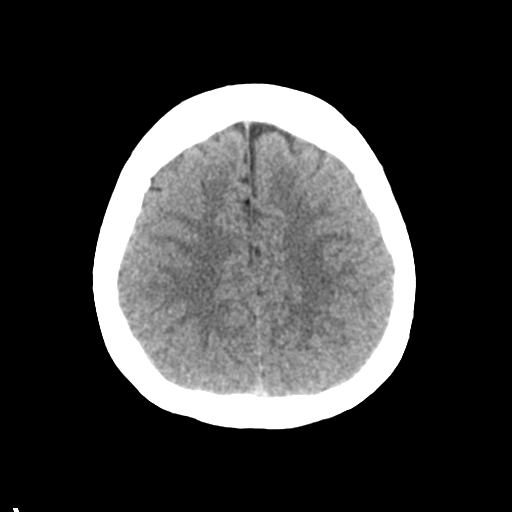
[im 25/33  brain]
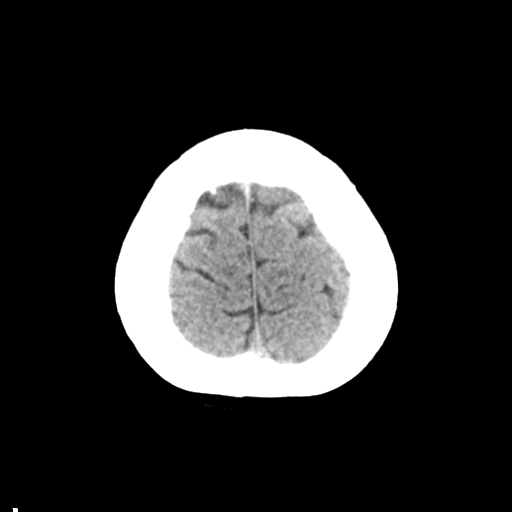
[im 27/33  brain]
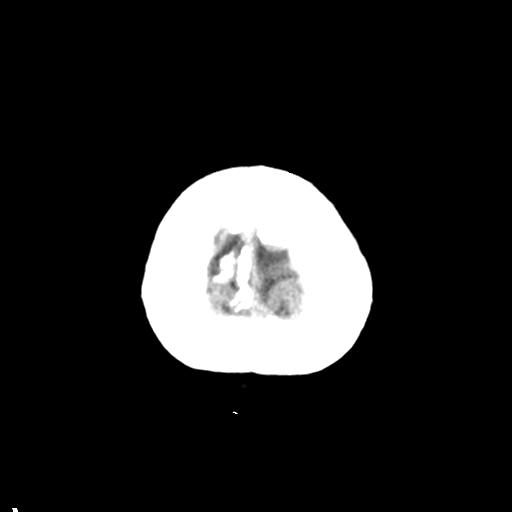
[im 27/33  bone]
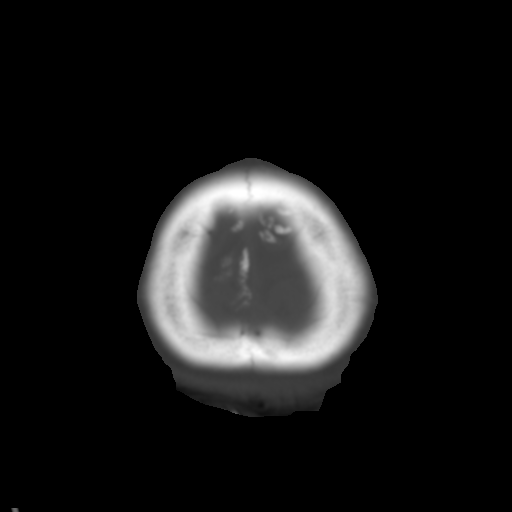
[im 30/33  brain]
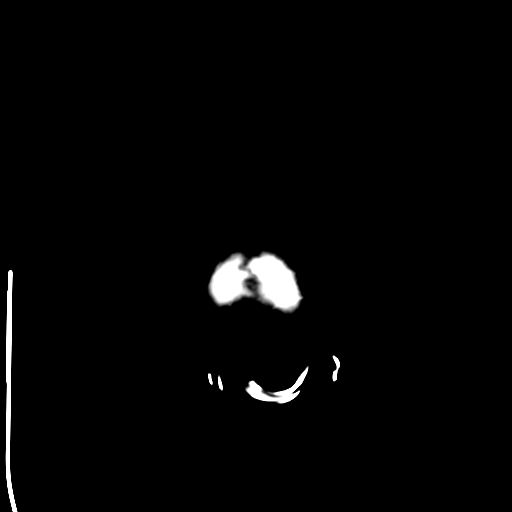

[Series 5: head 3.0 mpr cor · coronal · 0.32mm/px · 3 of 67 slices shown]
[im 23/67  brain]
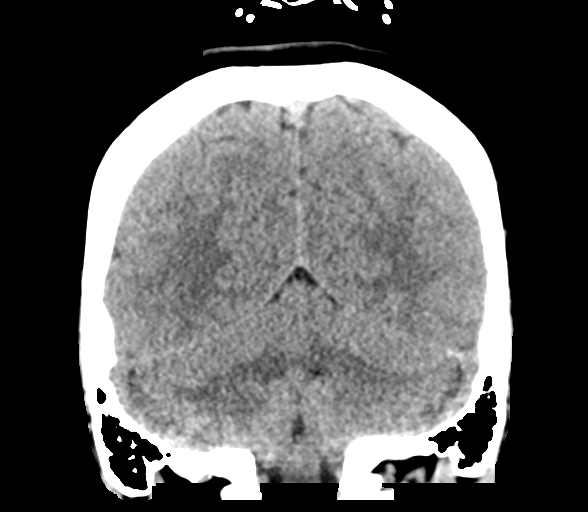
[im 30/67  brain]
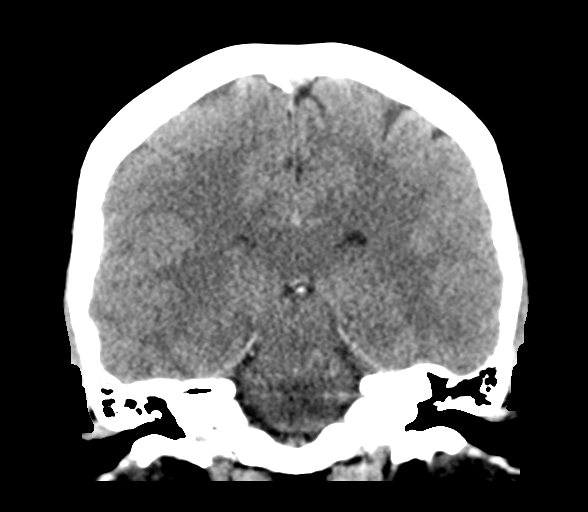
[im 37/67  brain]
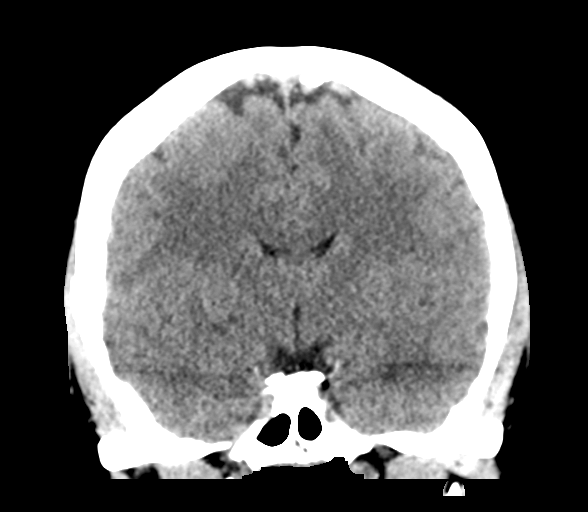

[Series 6: head 3.0 mpr sag · sagittal · 0.32mm/px · 3 of 61 slices shown]
[im 21/61  brain]
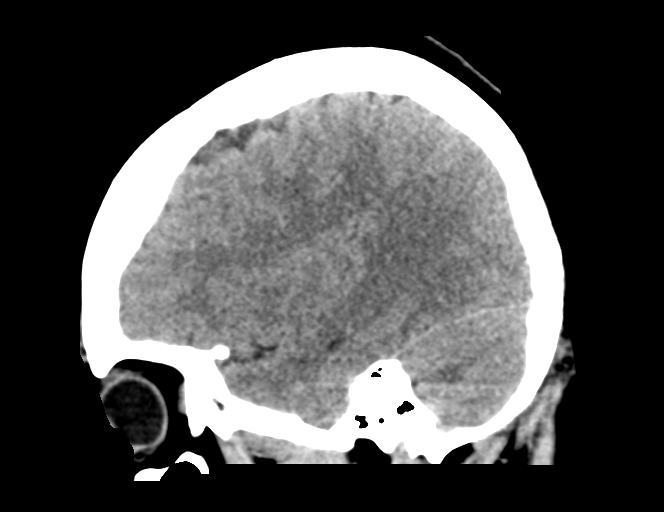
[im 31/61  brain]
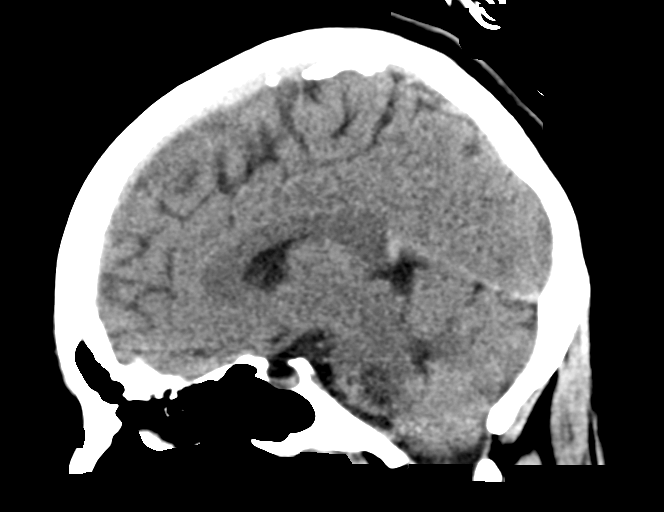
[im 41/61  brain]
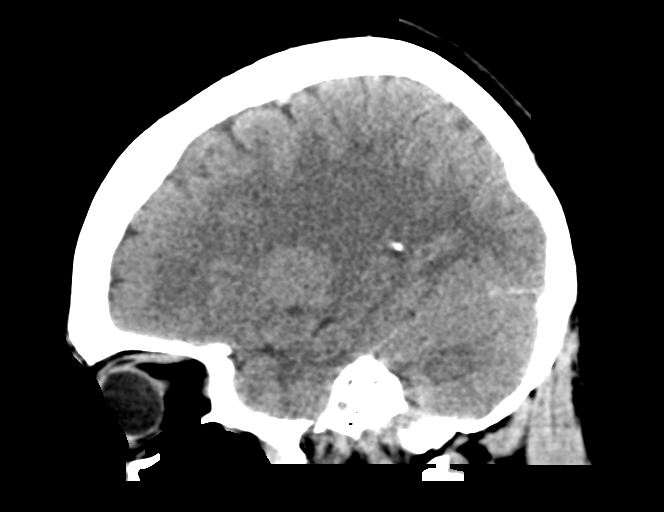

[16 of 47 positions shown; findings below may reference images not displayed]

FINDINGS: Brain: No evidence of acute infarction, hemorrhage, hydrocephalus,
extra-axial collection or mass lesion/mass effect.

Vascular: No hyperdense vessel or unexpected calcification.

Skull: Normal. Negative for fracture or focal lesion.

Sinuses/Orbits: Normal globes and orbits. The visualized sinuses and
mastoid air cells are clear.

Other: None.
IMPRESSION: Normal unenhanced CT scan of the brain.

## 2019-08-29 ENCOUNTER — Encounter (HOSPITAL_COMMUNITY): Payer: Self-pay | Admitting: *Deleted

## 2019-08-29 ENCOUNTER — Emergency Department (HOSPITAL_COMMUNITY)

## 2019-08-29 ENCOUNTER — Telehealth: Payer: Self-pay

## 2019-08-29 ENCOUNTER — Emergency Department (HOSPITAL_COMMUNITY)
Admission: EM | Admit: 2019-08-29 | Discharge: 2019-08-29 | Disposition: A | Discharge status: Home / Self Care | Attending: Emergency Medicine | Admitting: Emergency Medicine

## 2019-08-29 ENCOUNTER — Other Ambulatory Visit: Payer: Self-pay

## 2019-08-29 ENCOUNTER — Telehealth: Payer: Self-pay | Admitting: Primary Care

## 2019-08-29 DIAGNOSIS — R079 Chest pain, unspecified: Secondary | ICD-10-CM

## 2019-08-29 DIAGNOSIS — Z87891 Personal history of nicotine dependence: Secondary | ICD-10-CM | POA: Insufficient documentation

## 2019-08-29 DIAGNOSIS — M25512 Pain in left shoulder: Secondary | ICD-10-CM | POA: Diagnosis not present

## 2019-08-29 DIAGNOSIS — Z79899 Other long term (current) drug therapy: Secondary | ICD-10-CM | POA: Insufficient documentation

## 2019-08-29 LAB — CBC
HCT: 42.9 % (ref 36.0–46.0)
Hemoglobin: 13.9 g/dL (ref 12.0–15.0)
MCH: 28.3 pg (ref 26.0–34.0)
MCHC: 32.4 g/dL (ref 30.0–36.0)
MCV: 87.4 fL (ref 80.0–100.0)
Platelets: 266 10*3/uL (ref 150–400)
RBC: 4.91 MIL/uL (ref 3.87–5.11)
RDW: 12.7 % (ref 11.5–15.5)
WBC: 7.3 10*3/uL (ref 4.0–10.5)
nRBC: 0 % (ref 0.0–0.2)

## 2019-08-29 LAB — BASIC METABOLIC PANEL
Anion gap: 9 (ref 5–15)
BUN: 11 mg/dL (ref 6–20)
CO2: 23 mmol/L (ref 22–32)
Calcium: 8.9 mg/dL (ref 8.9–10.3)
Chloride: 105 mmol/L (ref 98–111)
Creatinine, Ser: 0.71 mg/dL (ref 0.44–1.00)
GFR calc Af Amer: >60 mL/min (ref >60)
GFR calc non Af Amer: >60 mL/min (ref >60)
Glucose, Bld: 113 mg/dL — ABNORMAL HIGH (ref 70–99)
Potassium: 3.8 mmol/L (ref 3.5–5.1)
Sodium: 137 mmol/L (ref 135–145)

## 2019-08-29 LAB — TROPONIN I (HIGH SENSITIVITY)
Troponin I (High Sensitivity): <2 ng/L (ref <18)
Troponin I (High Sensitivity): 2 ng/L (ref <18)

## 2019-08-29 LAB — I-STAT BETA HCG BLOOD, ED (MC, WL, AP ONLY): I-stat hCG, quantitative: <5 m[IU]/mL (ref <5)

## 2019-08-29 MED ORDER — SODIUM CHLORIDE 0.9% FLUSH: 3.0000 mL | Freq: Once | INTRAVENOUS | Status: DC

## 2019-08-29 MED ORDER — OMEPRAZOLE 20 MG PO CPDR: 20.0000 mg | DELAYED_RELEASE_CAPSULE | Freq: Every day | ORAL | 0 refills | Status: DC

## 2019-08-29 MED ORDER — LIDOCAINE VISCOUS HCL 2 % MT SOLN
15.0000 mL | Freq: Once | OROMUCOSAL | Status: DC
  Filled 2019-08-29: qty 15

## 2019-08-29 MED ORDER — ALUM & MAG HYDROXIDE-SIMETH 200-200-20 MG/5ML PO SUSP
30.0000 mL | Freq: Once | ORAL | Status: AC
  Administered 2019-08-29: 11:00:00 30 mL via ORAL
  Filled 2019-08-29: qty 30

## 2019-08-29 NOTE — ED Provider Notes (Signed)
[facility] EMERGENCY DEPARTMENT Provider Note   CSN: [id number] Arrival date & time: 08/29/19  [id number]     History Chief Complaint  Patient presents with  . Chest Pain  . Shoulder Pain    Deborah Nichols is a 34 y.o. female.  HPI  34 year old obese female with a history of back pain presents to ER with intermittent pressure/ache in chest which has been intermittent over last 2 weeks, but has become more persistent approximately 2 to 3 days ago.  She refers that she has had this problem before, she has been to the ER multiple times for work-ups which have come back negative.  She states she tried to call her cardiologist but they required a referral since she has not been seen in over 3 years.  She states that the pain is dull, manageable but uncomfortable.  She also reports pain in her left shoulder and neck, she has numbness down both arms but this is chronic.  She states the pain is makes it uncomfortable hard to sleep, forcing her to sleep sitting up.  Pain is relieved when she puts on her bra.  She has a history of gallbladder surgery in [place] in 2012 but no other surgeries.  She is not taking any medications.  States since this pain has come on 2 weeks ago she has made some dietary adjustments and started working out on Saturday.  Denies nausea, vomiting, fever, chills, sweating, syncope, lightheadedness, dizziness, weakness      Past Medical History:  Diagnosis Date  . Chest pain   . Chronic back pain   . Sciatica     Patient Active Problem List   Diagnosis Date Noted  . Vitamin D deficiency 06/15/2018  . Arthralgia of multiple joints 06/14/2018  . Generalized abdominal pain 01/12/2017  . Paresthesias 01/12/2017  . Sciatica   . Chest pain   . Back pain 03/03/2013    Past Surgical History:  Procedure Laterality Date  . CHOLECYSTECTOMY       OB History   No obstetric history on file.     Family History  Problem Relation Age of Onset  .  Hypertension Father   . Diabetes Father   . Coronary artery disease Sister     Social History   Tobacco Use  . Smoking status: Former Smoker    Types: Cigarettes  . Smokeless tobacco: Never Used  Substance Use Topics  . Alcohol use: No  . Drug use: No    Home Medications Prior to Admission medications   Medication Sig Start Date End Date Taking? Authorizing Provider  Multiple Vitamin (MULTIVITAMIN WITH MINERALS) TABS tablet Take 1 tablet by mouth once a week.    [provider]  omeprazole (PRILOSEC) 20 MG capsule Take 1 capsule (20 mg total) by mouth daily. Take 1 capsule before breakfast 08/29/19   Garald Balding, PA-C  Vitamin D, Ergocalciferol, (DRISDOL) 1.25 MG (50000 UT) CAPS capsule Take 1 capsule by mouth once weekly for 12 weeks. 06/16/18   Tower, Wynelle Fanny, MD    Allergies    Shellfish allergy  Review of Systems   Review of Systems  Constitutional: Negative for chills and fever.  Eyes: Negative for pain and visual disturbance.  Respiratory: Negative for cough and shortness of breath.   Cardiovascular: Positive for chest pain. Negative for palpitations.  Gastrointestinal: Negative for abdominal pain and vomiting.  Genitourinary: Negative for dysuria and hematuria.  Musculoskeletal: Positive for neck pain. Negative for arthralgias, back  pain, myalgias and neck stiffness.  Skin: Negative for color change and rash.  Neurological: Positive for numbness and headaches. Negative for dizziness, seizures, syncope, weakness and light-headedness.  Psychiatric/Behavioral: Negative for confusion.  All other systems reviewed and are negative.   Physical Exam Updated Vital Signs BP 118/70   Pulse 79   Temp 98.8 F (37.1 C) (Oral)   Resp 18   Ht 5\' 3"  (1.6 m)   Wt 98.4 kg   LMP 08/08/2019   SpO2 100%   BMI 38.44 kg/m   Physical Exam Vitals reviewed.  Constitutional:      General: She is not in acute distress.    Appearance: Normal appearance. She is obese.  She is not ill-appearing, toxic-appearing or diaphoretic.  HENT:     Head: Normocephalic and atraumatic.  Eyes:     General:        Right eye: No discharge.        Left eye: No discharge.     Extraocular Movements: Extraocular movements intact.     Conjunctiva/sclera: Conjunctivae normal.  Neck:     Thyroid: No thyromegaly.     Vascular: No JVD.  Cardiovascular:     Rate and Rhythm: Normal rate and regular rhythm.     Heart sounds: Normal heart sounds.     Comments: Intact radial and dorsalis pedis pulses Pulmonary:     Effort: Pulmonary effort is normal.     Breath sounds: Normal breath sounds.  Chest:     Chest wall: Tenderness present. No deformity or crepitus.     Comments: Pain reproducible on palpation Abdominal:     General: Bowel sounds are normal.     Palpations: Abdomen is soft.  Musculoskeletal:        General: No swelling. Normal range of motion.     Cervical back: Normal range of motion and neck supple.  Skin:    General: Skin is warm and dry.     Capillary Refill: Capillary refill takes less than 2 seconds.  Neurological:     General: No focal deficit present.     Mental Status: She is alert and oriented to person, place, and time.  Psychiatric:        Mood and Affect: Mood normal.        Behavior: Behavior normal.     ED Results / Procedures / Treatments   Labs (all labs ordered are listed, but only abnormal results are displayed) Labs Reviewed  BASIC METABOLIC PANEL - Abnormal; Notable for the following components:      Result Value   Glucose, Bld 113 (*)    All other components within normal limits  CBC  I-STAT BETA HCG BLOOD, ED ([facility], [facility], [facility] ONLY)  TROPONIN I (HIGH SENSITIVITY)  TROPONIN I (HIGH SENSITIVITY)    EKG EKG Interpretation  Date/Time:  Monday August 29 2019 09:22:45 EDT Ventricular Rate:  63 PR Interval:  118 QRS Duration: 96 QT Interval:  420 QTC Calculation: 429 R Axis:   -12 Text Interpretation: Normal sinus rhythm Low  voltage QRS No significant change since last tracing Confirmed by Blanchie Dessert [id number]) on 08/29/2019 11:19:43 AM   Radiology DG Chest 2 View  Result Date: 08/29/2019 CLINICAL DATA:  Chest pain EXAM: CHEST - 2 VIEW COMPARISON:  02/23/2015 FINDINGS: The heart size and mediastinal contours are within normal limits. Both lungs are clear. The visualized skeletal structures are unremarkable. IMPRESSION: No acute abnormality of the lungs. Electronically Signed   By: Eddie Candle  M.D.   On: 08/29/2019 10:05    Procedures Procedures (including critical care time)  Medications Ordered in ED Medications  sodium chloride flush (NS) 0.9 % injection 3 mL (0 mLs Intravenous Hold 08/29/19 1000)  alum & mag hydroxide-simeth (MAALOX/MYLANTA) 200-200-20 MG/5ML suspension 30 mL (30 mLs Oral Given 08/29/19 1041)    And  lidocaine (XYLOCAINE) 2 % viscous mouth solution 15 mL (15 mLs Oral Refused 08/29/19 1041)    ED Course  I have reviewed the triage vital signs and the nursing notes.  Pertinent labs & imaging results that were available during my care of the patient were reviewed by me and considered in my medical decision making (see chart for details).  Heart pathway score 1   MDM Rules/Calculators/A&P                     34 year old obese female with chest pain, shoulder pain and neck pain. On arrival the patient is nontoxic in no acute distress.  Vitals unremarkable other than a BMI of 38.  Patient is generally tender to chest wall, neck, left paraspinal and trapezius muscles.  Delta troponin negative.  BMP without significant electrolyte abnormalities or renal dysfunction.  Chest x-ray negative for acute cardiopulmonary disease.  Negative hCG.  No abdominal pain, tachypnea, diaphoresis.  No unilateral swelling in the legs.  EKG normal sinus rhythm.  Patient does state that she has had this before approximately 3 years ago, had a negative work-up and was referred to cardiology.  She voices that she  would like to see cardiology and GI.  Patient was given GI cocktail in the ED and notes improvement in her symptoms.  Patient has had an echo done with Dr. Illene Bolus in 2017 which was normal.  Given negative work-up for ACS, doubt dissection, acute abdomen, pneumonia, PE, DVT.  Referral to Dr. Doristine Johns office and [facility], and encouraged prompt follow-up.  I discussed the plan with the patient she is agreeable with this plan.  Final Clinical Impression(s) / ED Diagnoses Final diagnoses:  Chest pain, unspecified type    Rx / DC Orders ED Discharge Orders         Ordered    omeprazole (PRILOSEC) 20 MG capsule  Daily     08/29/19 1055           Lyndel Safe 09/06/19 [id number]    Noemi Chapel, MD 09/07/19 [phone]

## 2019-08-29 NOTE — Telephone Encounter (Signed)
Took escalated call from agent in reference to patient having chest pains. She wanted to be seen today but she has not been a patient in heartcare for over three years. Advised patient to go to the ED but at the time of call patient did not want to. She will reach out to her PCP.    **Late entry**

## 2019-08-29 NOTE — Telephone Encounter (Signed)
Noted. Patient currently at [facility].

## 2019-08-29 NOTE — Telephone Encounter (Signed)
Pt has mid dull achy pain in chest that is continuous and feels like that chest is being compressed, lt and rt shoulder pain and numbness in lt arm and radiates to back of neck. for 1 wk. No SOB, Pt cannot see cardiology because has not been seen in over 3 yrs and would need a referral. Pt is having abd pain and diarrhea but has IBS; pt is also feeling fatigued. Pt will go to [facility] ED now. FYI to Gentry Fitz NP.

## 2019-08-29 NOTE — ED Triage Notes (Signed)
Pt reports having chest pain this week and radiates into her neck and shoulders. Describes as dull pain and similar to heartburn. Had similar episodes 3 years ago and no diagnosis made. No acute distress noted at triage.

## 2019-08-29 NOTE — ED Notes (Signed)
Pt was able to swallow half of the Maalox  And refused the Lidocaine d/t the taste. The medication action was explained, pt made an informed decision not to take it.

## 2019-08-29 NOTE — Discharge Instructions (Addendum)
You were seen in the ER for chest pain.  Your work-up was negative for a heart attack or any other life-threatening causes of chest pain.  Your symptoms could be due to gastric reflux.  I have prescribed omeprazole also known as Prilosec which is available over-the-counter as well at any pharmacy.  Take 1 pill before breakfast every morning to prevent reflux symptoms.  Continue to take Tums symptomatically.  I have provided a phone number for Dr. Irish Lack with cardiology  and [facility] gastroenterology for follow-up.  It is important that you follow-up with them as soon as possible.  Please see your primary care provider for further diet/nutrition management options. Return to the ER if your symptoms worsen

## 2019-08-30 ENCOUNTER — Encounter: Payer: Self-pay | Admitting: Primary Care

## 2019-08-30 ENCOUNTER — Other Ambulatory Visit: Payer: Self-pay | Admitting: Primary Care

## 2019-08-30 ENCOUNTER — Ambulatory Visit (INDEPENDENT_AMBULATORY_CARE_PROVIDER_SITE_OTHER): Admitting: Primary Care

## 2019-08-30 VITALS — BP 114/72 | HR 82 | Temp 96.7°F | Ht 63.0 in | Wt 217.5 lb

## 2019-08-30 DIAGNOSIS — E559 Vitamin D deficiency, unspecified: Secondary | ICD-10-CM | POA: Diagnosis not present

## 2019-08-30 DIAGNOSIS — R079 Chest pain, unspecified: Secondary | ICD-10-CM | POA: Diagnosis not present

## 2019-08-30 DIAGNOSIS — G8929 Other chronic pain: Secondary | ICD-10-CM

## 2019-08-30 DIAGNOSIS — E669 Obesity, unspecified: Secondary | ICD-10-CM

## 2019-08-30 DIAGNOSIS — M5440 Lumbago with sciatica, unspecified side: Secondary | ICD-10-CM | POA: Diagnosis not present

## 2019-08-30 LAB — LIPID PANEL
Cholesterol: 166 mg/dL (ref 0–200)
HDL: 38.9 mg/dL — ABNORMAL LOW (ref >39.00)
LDL Cholesterol: 113 mg/dL — ABNORMAL HIGH (ref 0–99)
NonHDL: 127.25
Total CHOL/HDL Ratio: 4
Triglycerides: 71 mg/dL (ref 0.0–149.0)
VLDL: 14.2 mg/dL (ref 0.0–40.0)

## 2019-08-30 LAB — VITAMIN B12: Vitamin B-12: 217 pg/mL (ref 211–911)

## 2019-08-30 LAB — HEMOGLOBIN A1C: Hgb A1c MFr Bld: 5.6 % (ref 4.6–6.5)

## 2019-08-30 LAB — VITAMIN D 25 HYDROXY (VIT D DEFICIENCY, FRACTURES): VITD: 9.39 ng/mL — ABNORMAL LOW (ref 30.00–100.00)

## 2019-08-30 MED ORDER — VITAMIN D (ERGOCALCIFEROL) 1.25 MG (50000 UNIT) PO CAPS: ORAL_CAPSULE | ORAL | 0 refills | Status: DC

## 2019-08-30 NOTE — Assessment & Plan Note (Signed)
Recent ED visit for symptoms. Improved with GI cocktail.  Encouraged weight loss through diet and exercise. She will be seeing GI soon.  ED notes, labs, imaging reviewed.

## 2019-08-30 NOTE — Assessment & Plan Note (Signed)
Chronic for years, intermittent paresthesias down lower extremities. [facility]  Strongly advised weight loss through diet and activity. No alarm signs.

## 2019-08-30 NOTE — Patient Instructions (Signed)
Stop by the lab prior to leaving today. I will notify you of your results once received.   You will be contacted regarding your referral to the nutritionist.  Please let us know if you have not been contacted within two weeks.   You can call [facility] in [place] for regular consultation.  It was a pleasure to see you today!

## 2019-08-30 NOTE — Progress Notes (Signed)
Subjective:    Patient ID: Deborah Nichols, female    DOB: 04/09/1986, 34 y.o.   MRN: [id number]  HPI  This visit occurred during the SARS-CoV-2 public health emergency.  Safety protocols were in place, including screening questions prior to the visit, additional usage of staff PPE, and extensive cleaning of exam room while observing appropriate contact time as indicated for disinfecting solutions.   Deborah Nichols is a 34 year old female with a history of back pain, sciatica, paresthesias who presents today for ED follow up.   She presented to [facility] on 08/29/19 with a chief complaint of chest pain. He pain was located to the upper chest over the last two weeks, more persistent over the last 2-3 days. She's been evaluated in the ED several times before with same symptoms, work up was always negative. She did follow with cardiology, no recent visit in three years. History of cholecystectomy in 2021.   During her ED visit she underwent ECG, chest xray, urine pregnancy test, troponin series, all of which was negative. She was provided with a GI cocktail and noticed improvement. She was referred to GI for follow up.  Since her last visit her chest pressure symptoms have mostly resolved. She does notice chronic bilateral lower back pain with paresthesias down her bilateral lower extremities. [facility] due to these [facility], no recent visit.   She plans on scheduling appointment with GI. She's realizing that the majority of her symptoms, including GI and back/lower extremities, are secondary to her obesity. She's recently switched her diet and is eating more vegetables, yogurt smoothies, started exercising and has noticed improvement. She would like to be referred to a nutritionist.   Review of Systems  Respiratory: Negative for shortness of breath.   Cardiovascular: Negative for chest pain.  Gastrointestinal: Negative for abdominal pain, constipation and diarrhea.    Musculoskeletal: Positive for back pain.  Neurological: Positive for numbness. Negative for weakness.       Past Medical History:  Diagnosis Date  . Chest pain   . Chronic back pain   . Sciatica      Social History   Socioeconomic History  . Marital status: Married    Spouse name: Not on file  . Number of children: Not on file  . Years of education: Not on file  . Highest education level: Not on file  Occupational History  . Not on file  Tobacco Use  . Smoking status: Former Smoker    Types: Cigarettes  . Smokeless tobacco: Never Used  Substance and Sexual Activity  . Alcohol use: No  . Drug use: No  . Sexual activity: Yes    Birth control/protection: None  Other Topics Concern  . Not on file  Social History Narrative  . Not on file   Social Determinants of Health   Financial Resource Strain:   . Difficulty of Paying Living Expenses:   Food Insecurity:   . Worried About Charity fundraiser in the Last Year:   . Arboriculturist in the Last Year:   Transportation Needs:   . Film/video editor (Medical):   Marland Kitchen Lack of Transportation (Non-Medical):   Physical Activity:   . Days of Exercise per Week:   . Minutes of Exercise per Session:   Stress:   . Feeling of Stress :   Social Connections:   . Frequency of Communication with Friends and Family:   . Frequency of Social Gatherings  with Friends and Family:   . Attends Religious Services:   . Active Member of Clubs or Organizations:   . Attends Archivist Meetings:   Marland Kitchen Marital Status:   Intimate Partner Violence:   . Fear of Current or Ex-Partner:   . Emotionally Abused:   Marland Kitchen Physically Abused:   . Sexually Abused:     Past Surgical History:  Procedure Laterality Date  . CHOLECYSTECTOMY      Family History  Problem Relation Age of Onset  . Hypertension Father   . Diabetes Father   . Coronary artery disease Sister     Allergies  Allergen Reactions  . Shellfish Allergy Anaphylaxis,  Nausea And Vomiting and Rash    Current Outpatient Medications on File Prior to Visit  Medication Sig Dispense Refill  . ibuprofen (ADVIL) 200 MG tablet Take 400 mg by mouth daily as needed for moderate pain.    . Multiple Vitamin (MULTIVITAMIN WITH MINERALS) TABS tablet Take 1 tablet by mouth once a week.    Marland Kitchen omeprazole (PRILOSEC) 20 MG capsule Take 1 capsule (20 mg total) by mouth daily. Take 1 capsule before breakfast 30 capsule 0  . Vitamin D, Ergocalciferol, (DRISDOL) 1.25 MG (50000 UT) CAPS capsule Take 1 capsule by mouth once weekly for 12 weeks. (Patient not taking: Reported on 08/30/2019) 12 capsule 0   No current facility-administered medications on file prior to visit.    BP 114/72   Pulse 82   Temp (!) 96.7 F (35.9 C) (Temporal)   Ht 5\' 3"  (1.6 m)   Wt 217 lb 8 oz (98.7 kg)   LMP 08/08/2019   SpO2 98%   BMI 38.53 kg/m    Objective:   Physical Exam  Constitutional: She appears well-nourished.  Cardiovascular: Normal rate and regular rhythm.  Respiratory: Effort normal and breath sounds normal.  Musculoskeletal:     Cervical back: Neck supple.  Skin: Skin is warm and dry.           Assessment & Plan:

## 2019-08-30 NOTE — Assessment & Plan Note (Signed)
Agree that symptoms from recent and prior ED visits could be partially due to obesity. Referral placed to nutritionist for guidance. Encouraged regular exercise.

## 2019-08-30 NOTE — Assessment & Plan Note (Signed)
Not currently taking vitamin D. Repeat vitamin D level pending.

## 2019-08-31 ENCOUNTER — Encounter: Payer: Self-pay | Admitting: Gastroenterology

## 2019-08-31 ENCOUNTER — Encounter: Payer: Self-pay | Admitting: *Deleted

## 2019-09-08 ENCOUNTER — Encounter: Payer: Self-pay | Admitting: Skilled Nursing Facility1

## 2019-09-08 ENCOUNTER — Encounter: Attending: Primary Care | Admitting: Skilled Nursing Facility1

## 2019-09-08 ENCOUNTER — Other Ambulatory Visit: Payer: Self-pay

## 2019-09-08 DIAGNOSIS — Z713 Dietary counseling and surveillance: Secondary | ICD-10-CM | POA: Insufficient documentation

## 2019-09-08 DIAGNOSIS — E669 Obesity, unspecified: Secondary | ICD-10-CM | POA: Diagnosis not present

## 2019-09-08 DIAGNOSIS — Z6838 Body mass index (BMI) 38.0-38.9, adult: Secondary | ICD-10-CM | POA: Insufficient documentation

## 2019-09-08 NOTE — Progress Notes (Signed)
  Assessment:  Primary concerns today: obesity.   Pt states she does have daily reflux daily. Pt states some foods give her diarrhea. Pt states anytime she eats out she will have diarrhea. Pt states she stopped eating out as often due to the chest pain and diarrhea.  Pt had numerous questions which were answered to her satisfaction.  Labs: B12 217 Vitamin D 9.39 A1C 5.6  Body Composition Scale 09/08/2019  Current Body Weight 218.3  Total Body Fat % 111.2  Visceral Fat   Fat-Free Mass %    Total Body Water % 78.5 lbs  Muscle-Mass lbs   BMI   Body Fat Displacement          Torso  lbs          Left Leg  lbs          Right Leg  lbs          Left Arm  lbs          Right Arm   lbs      MEDICATIONS:     DIETARY INTAKE:  Usual eating pattern includes 3 meals and 2 snacks per day.    24-hr recall:  B ( AM): skipped or yogurt drink or egg and cheese and salami sandwich Snk ( AM): granola L ( 1-2 PM): chicken tenders + tater tots  Snk ( PM): ice cream or fruit or cereal  D ( PM): rice and beans and chicken or spagetti  Snk ( PM):  Beverages: sweet tea, flavored water, wine, hot tea + lemon + honey + sugar  Usual physical activity: ADL's  Estimated energy needs: 1600 calories    Intervention:  Nutrition obesity.  Goals: Do not comment on your daughters weight; do not single your daughter out from your other children for her weight  Aim to stop when you are satisfied not feeling the full Aim to identify hunger verses satisfaction  Eat Every 3-5 hours a day Aim to have non starchy vegetables 2 times a day 7 days a week  Avoid adding ranch to your food  Avoid putting cheese in your salad Do not add sugar in your tea when you are using honey Aim to be active for 30+ minutes most days of the week Aim for 80 ounce of fluid per day  Teaching Method Utilized:  Visual Auditory Hands on  Handouts given during visit include:  Detailed myplate  Barriers to  learning/adherence to lifestyle change: none identified  Demonstrated degree of understanding via:  Teach Back   Monitoring/Evaluation:  Dietary intake, exercise, and body weight prn.

## 2019-09-12 ENCOUNTER — Telehealth: Payer: Self-pay | Admitting: Primary Care

## 2019-09-12 NOTE — Telephone Encounter (Signed)
Please thank patient for the update.  The nutritionist made no mention of this in her notes.   Based off of our last appointment she is followed at the "[facility]", correct? If this is correct then she's already seeing a neurosurgeon. Can she confirm?

## 2019-09-12 NOTE — Telephone Encounter (Signed)
Patient returned your call.

## 2019-09-12 NOTE — Telephone Encounter (Signed)
Message left for patient to return my call.  

## 2019-09-12 NOTE — Telephone Encounter (Signed)
Patient called today   She wanted to let you know she went to see nutritionist She also scheduled appointment with Rudolpho Sevin for 4/15  When she seen the Nutritionist they stated that with her symptoms she may need to see a neurologist. Patient would like a referral placed to see neuro as soon as possible so she can been seen   Also, Patient stated she picked up medications and will start them this week.   Patient is requesting a call back

## 2019-09-13 NOTE — Telephone Encounter (Signed)
Spoken and notified patient of Deborah Nichols comments.  Patient stated that she saw Dr Deborah Feinstein, MD for her back years ago. I have look up this provider who is with [facility] and non-surgical orthopaedics. Patient stated that she will see if she can at Dr Deborah Nichols office to sent records .So with this information, patient would like a referral to neurology.  Patient stated that she would like to know if Deborah Nichols can complete a Handicap [place]. Her prior pcp filled out the last one and hers is almost expire.  Patient asked for a referral to see a dietitian. She check with her insurance and this may be another option for her since it is hard to get an appointment at [facility].

## 2019-09-14 NOTE — Telephone Encounter (Addendum)
Please notify patient that I am happy to review her records received from Dr. Layne Benton. I need more information about her request for neurology referral and am happy to discuss with her virtually or back in the office.   Unfortunately I cannot provide her with a handicap placard.

## 2019-09-15 ENCOUNTER — Ambulatory Visit: Admitting: Gastroenterology

## 2019-09-15 NOTE — Telephone Encounter (Signed)
Spoken and notified patient of Kate Clark's comments. Patient verbalized understanding.  

## 2019-09-27 ENCOUNTER — Ambulatory Visit: Admitting: Gastroenterology

## 2019-11-17 ENCOUNTER — Ambulatory Visit (INDEPENDENT_AMBULATORY_CARE_PROVIDER_SITE_OTHER): Admitting: Family Medicine

## 2019-11-17 ENCOUNTER — Other Ambulatory Visit: Payer: Self-pay

## 2019-11-17 ENCOUNTER — Encounter (INDEPENDENT_AMBULATORY_CARE_PROVIDER_SITE_OTHER): Payer: Self-pay | Admitting: Family Medicine

## 2019-11-17 VITALS — BP 103/70 | HR 61 | Temp 98.2°F | Ht 65.0 in | Wt 213.0 lb

## 2019-11-17 DIAGNOSIS — R0602 Shortness of breath: Secondary | ICD-10-CM

## 2019-11-17 DIAGNOSIS — E559 Vitamin D deficiency, unspecified: Secondary | ICD-10-CM | POA: Diagnosis not present

## 2019-11-17 DIAGNOSIS — M5431 Sciatica, right side: Secondary | ICD-10-CM

## 2019-11-17 DIAGNOSIS — Z9189 Other specified personal risk factors, not elsewhere classified: Secondary | ICD-10-CM | POA: Diagnosis not present

## 2019-11-17 DIAGNOSIS — R5383 Other fatigue: Secondary | ICD-10-CM

## 2019-11-17 DIAGNOSIS — Z6835 Body mass index (BMI) 35.0-35.9, adult: Secondary | ICD-10-CM

## 2019-11-17 DIAGNOSIS — Z0289 Encounter for other administrative examinations: Secondary | ICD-10-CM

## 2019-11-17 DIAGNOSIS — F3289 Other specified depressive episodes: Secondary | ICD-10-CM | POA: Diagnosis not present

## 2019-11-17 DIAGNOSIS — E538 Deficiency of other specified B group vitamins: Secondary | ICD-10-CM

## 2019-11-17 DIAGNOSIS — K588 Other irritable bowel syndrome: Secondary | ICD-10-CM

## 2019-11-17 DIAGNOSIS — G4709 Other insomnia: Secondary | ICD-10-CM

## 2019-11-17 DIAGNOSIS — M5432 Sciatica, left side: Secondary | ICD-10-CM

## 2019-11-18 LAB — CBC WITH DIFFERENTIAL/PLATELET
Basophils Absolute: 0.1 10*3/uL (ref 0.0–0.2)
Basos: 1 %
EOS (ABSOLUTE): 0.1 10*3/uL (ref 0.0–0.4)
Eos: 1 %
Hemoglobin: 14.9 g/dL (ref 11.1–15.9)
Immature Grans (Abs): 0.1 10*3/uL (ref 0.0–0.1)
Immature Granulocytes: 1 %
Lymphocytes Absolute: 2.8 10*3/uL (ref 0.7–3.1)
Lymphs: 34 %
MCH: 29.3 pg (ref 26.6–33.0)
MCHC: 33 g/dL (ref 31.5–35.7)
MCV: 89 fL (ref 79–97)
Monocytes Absolute: 0.4 10*3/uL (ref 0.1–0.9)
Monocytes: 5 %
Neutrophils Absolute: 4.8 10*3/uL (ref 1.4–7.0)
Neutrophils: 58 %
Platelets: 294 10*3/uL (ref 150–450)
RBC: 5.09 x10E6/uL (ref 3.77–5.28)
RDW: 12.3 % (ref 11.7–15.4)
WBC: 8.3 10*3/uL (ref 3.4–10.8)

## 2019-11-18 LAB — COMPREHENSIVE METABOLIC PANEL
ALT: 24 IU/L (ref 0–32)
AST: 21 IU/L (ref 0–40)
Albumin/Globulin Ratio: 1.5 (ref 1.2–2.2)
Albumin: 4.4 g/dL (ref 3.8–4.8)
Alkaline Phosphatase: 90 IU/L (ref 48–121)
BUN/Creatinine Ratio: 14 (ref 9–23)
BUN: 9 mg/dL (ref 6–20)
Bilirubin Total: 0.4 mg/dL (ref 0.0–1.2)
CO2: 24 mmol/L (ref 20–29)
Calcium: 9.4 mg/dL (ref 8.7–10.2)
Chloride: 101 mmol/L (ref 96–106)
Creatinine, Ser: 0.65 mg/dL (ref 0.57–1.00)
GFR calc Af Amer: 133 mL/min/{1.73_m2} (ref >59)
GFR calc non Af Amer: 115 mL/min/{1.73_m2} (ref >59)
Globulin, Total: 2.9 g/dL (ref 1.5–4.5)
Glucose: 92 mg/dL (ref 65–99)
Potassium: 4 mmol/L (ref 3.5–5.2)
Sodium: 137 mmol/L (ref 134–144)
Total Protein: 7.3 g/dL (ref 6.0–8.5)

## 2019-11-18 LAB — ANEMIA PANEL
Ferritin: 30 ng/mL (ref 15–150)
Folate, Hemolysate: 315 ng/mL
Folate, RBC: 697 ng/mL (ref >498)
Hematocrit: 45.2 % (ref 34.0–46.6)
Iron Saturation: 24 % (ref 15–55)
Iron: 81 ug/dL (ref 27–159)
Retic Ct Pct: 1.4 % (ref 0.6–2.6)
Total Iron Binding Capacity: 343 ug/dL (ref 250–450)
UIBC: 262 ug/dL (ref 131–425)
Vitamin B-12: 303 pg/mL (ref 232–1245)

## 2019-11-18 LAB — T3: T3, Total: 128 ng/dL (ref 71–180)

## 2019-11-18 LAB — TSH: TSH: 1.8 u[IU]/mL (ref 0.450–4.500)

## 2019-11-18 LAB — T4, FREE: Free T4: 1.06 ng/dL (ref 0.82–1.77)

## 2019-11-18 LAB — INSULIN, RANDOM: INSULIN: 23.1 u[IU]/mL (ref 2.6–24.9)

## 2019-11-21 NOTE — Progress Notes (Signed)
Chief Complaint:   OBESITY Deborah Nichols (MR# [id number]) is a 34 y.o. female who presents for evaluation and treatment of obesity and related comorbidities. Current BMI is Body mass index is 35.45 kg/m. Deborah Nichols has been struggling with her weight for many years and has been unsuccessful in either losing weight, maintaining weight loss, or reaching her Deborah weight goal.  Deborah Nichols is currently in the action stage of change and ready to dedicate time achieving and maintaining a healthier weight. Deborah Nichols is interested in becoming our patient and working on intensive lifestyle modifications including (but not limited to) diet and exercise for weight loss.  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  She is married with 3 children, ages 54, 4, and 59.  She prefers to stay off medications.  Deborah Nichols's habits were reviewed today and are as follows: Her family eats meals together, she thinks her family will eat healthier with her, her desired weight loss is 79 pounds, she started gaining weight after her third child, her heaviest weight ever was 218 pounds, she craves sweets and potatoes, she wakes up frequently in the middle of the night to eat, she skips breakfast frequently, she is frequently drinking liquids with calories, she frequently eats larger portions than normal and she struggles with emotional eating.  Depression Screen Deborah Nichols's Food and Mood (modified PHQ-9) score was 4.  Depression screen PHQ 2/9 11/17/2019  Decreased Interest 1  Down, Depressed, Hopeless 0  PHQ - 2 Score 1  Altered sleeping 1  Tired, decreased energy 1  Change in appetite 1  Feeling bad or failure about yourself  0  Trouble concentrating 0  Moving slowly or fidgety/restless 0  Suicidal thoughts 0  PHQ-9 Score 4  Difficult doing work/chores Not difficult at all   Subjective:   1. Other fatigue Deborah Nichols denies daytime somnolence and denies waking up still tired. Patent has a history of symptoms of  daytime fatigue. Deborah Nichols generally gets 5 or 6 hours of sleep per night, and states that she has generally restful sleep. Snoring is not present. Apneic episodes are not present. Epworth Sleepiness Score is 0.  2. SOB (shortness of breath) on exertion Deborah Nichols notes increasing shortness of breath with exercising and seems to be worsening over time with weight gain. She notes getting out of breath sooner with activity than she used to. This has not gotten worse recently. Deborah Nichols denies shortness of breath at rest or orthopnea.  3. Other irritable bowel syndrome Deborah Nichols has IBS and takes a probiotic and/or eats yogurt daily.  4. Vitamin D deficiency Deborah Nichols's Vitamin D level was 9.39 on 08/30/2019. She is currently taking no vitamin D supplement. She denies nausea, vomiting or muscle weakness.  5. B12 deficiency She is not a vegetarian.  She does not have a previous diagnosis of pernicious anemia.  She does not have a history of weight loss surgery.   Lab Results  Component Value Date   VITAMINB12 303 11/17/2019   6. Bilateral sciatica Deborah Nichols says she is open to self-guided PT.  She says that Dr. Layne Benton has recommended surgery in the future.  7. Other insomnia Symptoms include: has difficulty falling asleep. Estimated average sleep per night: 5-6 hours.  8. Other depression, with emotional eating Deborah Nichols is struggling with emotional eating and using food for comfort to the extent that it is negatively impacting her health. She has been working on behavior modification techniques to help reduce her emotional eating and has been unsuccessful.  She shows no sign of suicidal or homicidal ideations.  9. At risk for constipation Deborah Nichols is at increased risk for constipation due to inadequate water intake, changes in diet, and/or use of medications such as GLP1 agonists. Deborah Nichols denies hard, infrequent stools currently.   Assessment/Plan:   1. Other fatigue Deborah Nichols does feel that her weight is causing  her energy to be lower than it should be. Fatigue may be related to obesity, depression or many other causes. Labs will be ordered, and in the meanwhile, Deborah Nichols will focus on self care including making Deborah food choices, increasing physical activity and focusing on stress reduction.  Orders - EKG 12-Lead - CBC with Differential/Platelet - Comprehensive metabolic panel - Insulin, random - T3 - T4, free - TSH - Anemia panel  2. SOB (shortness of breath) on exertion Deborah Nichols does feel that she gets out of breath more easily that she used to when she exercises. [name]'s shortness of breath appears to be obesity related and exercise induced. She has agreed to work on weight loss and gradually increase exercise to treat her exercise induced shortness of breath. Will continue to monitor closely.  3. Other irritable bowel syndrome Will continue to monitor.  Orders - CBC with Differential/Platelet - Comprehensive metabolic panel  4. Vitamin D deficiency Deborah Nichols has vitamin D 50,000 units on hand and will begin taking it this week.  5. B12 deficiency The diagnosis was reviewed with the patient. Counseling provided today, see below. We will continue to monitor. Orders and follow up as documented in patient record.  Counseling . The body needs vitamin B12: to make red blood cells; to make DNA; and to help the nerves work properly so they can carry messages from the brain to the body.  . The main causes of vitamin B12 deficiency include dietary deficiency, digestive diseases, pernicious anemia, and having a surgery in which part of the stomach or small intestine is removed.  . Certain medicines can make it harder for the body to absorb vitamin B12. These medicines include: heartburn medications; some antibiotics; some medications used to treat diabetes, gout, and high cholesterol.  . In some cases, there are no symptoms of this condition. If the condition leads to anemia or nerve damage, various  symptoms can occur, such as weakness or fatigue, shortness of breath, and numbness or tingling in your hands and feet.   . Treatment:  o May include taking vitamin B12 supplements.  o Avoid alcohol.  o Eat lots of Deborah foods that contain vitamin B12: - Beef, pork, chicken, [place], and organ meats, such as liver.  - Seafood: This includes clams, rainbow trout, salmon, tuna, and haddock. Eggs.  - Cereal and dairy products that are fortified: This means that vitamin B12 has been added to the food.   6. Bilateral sciatica Question neurosurgery.  7. Other insomnia The problem of recurrent insomnia was discussed. Orders and follow up as documented in patient record. Counseling: Intensive lifestyle modifications are the first line treatment for this issue. We discussed several lifestyle modifications today and she will continue to work on diet, exercise and weight loss efforts.   Counseling  Limit or avoid alcohol, caffeinated beverages, and cigarettes, especially close to bedtime.   Do not eat a large meal or eat spicy foods right before bedtime. This can lead to digestive discomfort that can make it hard for you to sleep.  Keep a sleep diary to help you and your health care provider figure out what could be  causing your insomnia.  . Make your bedroom a dark, comfortable place where it is easy to fall asleep. ? Put up shades or blackout curtains to block light from outside. ? Use a white noise machine to block noise. ? Keep the temperature cool. . Limit screen use before bedtime. This includes: ? Watching TV. ? Using your smartphone, tablet, or computer. . Stick to a routine that includes going to bed and waking up at the same times every day and night. This can help you fall asleep faster. Consider making a quiet activity, such as reading, part of your nighttime routine. . Try to avoid taking naps during the day so that you sleep better at night. . Get out of bed if you are still awake  after 15 minutes of trying to sleep. Keep the lights down, but try reading or doing a quiet activity. When you feel sleepy, go back to bed.  8. Other depression, with emotional eating Behavior modification techniques were discussed today to help Deborah Nichols deal with her emotional/non-hunger eating behaviors.  Orders and follow up as documented in patient record.  Argie declined referral to Dr. Mallie Mussel at this time.  9. At risk for constipation Deborah Nichols was given approximately 15 minutes of counseling today regarding prevention of constipation. She was encouraged to increase water and fiber intake.   10. Class 2 severe obesity with serious comorbidity and body mass index (BMI) of 35.0 to 35.9 in adult, unspecified obesity type [facility]) Deborah Nichols is currently in the action stage of change and her goal is to continue with weight loss efforts. I recommend Trenae begin the structured treatment plan as follows:  She has agreed to the Category 1 Plan.  Exercise goals: No exercise has been prescribed at this time.   Behavioral modification strategies: increasing lean protein intake, decreasing simple carbohydrates, increasing vegetables, increasing water intake, decreasing sodium intake and increasing high fiber foods.  She was informed of the importance of frequent follow-up visits to maximize her success with intensive lifestyle modifications for her multiple health conditions. She was informed we would discuss her lab results at her next visit unless there is a critical issue that needs to be addressed sooner. Tylar agreed to keep her next visit at the agreed upon time to discuss these results.  Objective:   Blood pressure 103/70, pulse 61, temperature 98.2 F (36.8 C), temperature source Oral, height 5\' 5"  (1.651 m), weight 213 lb (96.6 kg), SpO2 97 %. Body mass index is 35.45 kg/m.  EKG: Normal sinus rhythm, rate 60 bpm.  Indirect Calorimeter completed today shows a VO2 of 192 and a REE of 1334.  Her  calculated basal metabolic rate is 9528 thus her basal metabolic rate is worse than expected.  General: Cooperative, alert, well developed, in no acute distress. HEENT: Conjunctivae and lids unremarkable. Cardiovascular: Regular rhythm.  Lungs: Normal work of breathing. Neurologic: No focal deficits.   Lab Results  Component Value Date   CREATININE 0.65 11/17/2019   BUN 9 11/17/2019   NA 137 11/17/2019   K 4.0 11/17/2019   CL 101 11/17/2019   CO2 24 11/17/2019   Lab Results  Component Value Date   ALT 24 11/17/2019   AST 21 11/17/2019   ALKPHOS 90 11/17/2019   BILITOT 0.4 11/17/2019   Lab Results  Component Value Date   HGBA1C 5.6 08/30/2019   HGBA1C 5.5 01/12/2017   Lab Results  Component Value Date   INSULIN 23.1 11/17/2019   Lab Results  Component  Value Date   TSH 1.800 11/17/2019   Lab Results  Component Value Date   CHOL 166 08/30/2019   HDL 38.90 (L) 08/30/2019   LDLCALC 113 (H) 08/30/2019   TRIG 71.0 08/30/2019   CHOLHDL 4 08/30/2019   Lab Results  Component Value Date   WBC 8.3 11/17/2019   HGB 14.9 11/17/2019   HCT 45.2 11/17/2019   MCV 89 11/17/2019   PLT 294 11/17/2019   Lab Results  Component Value Date   IRON 81 11/17/2019   TIBC 343 11/17/2019   FERRITIN 30 11/17/2019   Attestation Statements:   This is the patient's first visit at [facility]. The patient's NEW PATIENT PACKET was reviewed at length. Included in the packet: current and past health history, medications, allergies, ROS, gynecologic history (women only), surgical history, family history, social history, weight history, weight loss surgery history (for those that have had weight loss surgery), nutritional evaluation, mood and food questionnaire, PHQ9, Epworth questionnaire, sleep habits questionnaire, patient life and health improvement goals questionnaire. These will all be scanned into the patient's chart under media.   During the visit, I independently  reviewed the patient's EKG, bioimpedance scale results, and indirect calorimeter results. I used this information to tailor a meal plan for the patient that will help her to lose weight and will improve her obesity-related conditions going forward. I performed a medically necessary appropriate examination and/or evaluation. I discussed the assessment and treatment plan with the patient. The patient was provided an opportunity to ask questions and all were answered. The patient agreed with the plan and demonstrated an understanding of the instructions. Labs were ordered at this visit and will be reviewed at the next visit unless more critical results need to be addressed immediately. Clinical information was updated and documented in the EMR.   I, Water quality scientist, CMA, am acting as transcriptionist for Briscoe Deutscher, DO  I have reviewed the above documentation for accuracy and completeness, and I agree with the above. Briscoe Deutscher, DO

## 2019-12-01 ENCOUNTER — Encounter (INDEPENDENT_AMBULATORY_CARE_PROVIDER_SITE_OTHER): Payer: Self-pay | Admitting: Family Medicine

## 2019-12-01 ENCOUNTER — Other Ambulatory Visit: Payer: Self-pay

## 2019-12-01 ENCOUNTER — Ambulatory Visit (INDEPENDENT_AMBULATORY_CARE_PROVIDER_SITE_OTHER): Admitting: Family Medicine

## 2019-12-01 VITALS — BP 102/66 | HR 63 | Temp 98.8°F | Ht 63.0 in

## 2019-12-01 DIAGNOSIS — E538 Deficiency of other specified B group vitamins: Secondary | ICD-10-CM | POA: Diagnosis not present

## 2019-12-01 DIAGNOSIS — E8881 Metabolic syndrome: Secondary | ICD-10-CM | POA: Diagnosis not present

## 2019-12-01 DIAGNOSIS — Z6837 Body mass index (BMI) 37.0-37.9, adult: Secondary | ICD-10-CM

## 2019-12-01 DIAGNOSIS — R790 Abnormal level of blood mineral: Secondary | ICD-10-CM

## 2019-12-07 NOTE — Progress Notes (Signed)
Chief Complaint:   OBESITY Deborah Nichols is here to discuss her progress with her obesity treatment plan along with follow-up of her obesity related diagnoses. Deborah Nichols is on the Category 1 Plan and states she is following her eating plan approximately 100% of the time. Deborah Nichols states she is exercising for 0 minutes 0 times per week.  Today's visit was #: 2 Starting weight: 213 lbs Starting date: 11/17/2019 Today's weight: 209 lbs Today's date: 12/01/2019 Total lbs lost to date: 4 lbs Total lbs lost since last in-office visit: 4 lbs  Interim History: Recie endorses polyphagia, but is happy overall with the plan.  She says she is getting her family involved.  Subjective:   1. Insulin resistance Deborah Nichols has a diagnosis of insulin resistance based on her elevated fasting insulin level >5. She continues to work on diet and exercise to decrease her risk of diabetes.  Lab Results  Component Value Date   INSULIN 23.1 11/17/2019   Lab Results  Component Value Date   HGBA1C 5.6 08/30/2019   2. B12 deficiency She is not a vegetarian.  She does not have a previous diagnosis of pernicious anemia.  She does not have a history of weight loss surgery.   Lab Results  Component Value Date   VITAMINB12 303 11/17/2019   3. Low ferritin [name] is not a vegetarian.  She does not have a history of weight loss surgery.   CBC Latest Ref Rng & Units 11/17/2019 08/29/2019 06/04/2018  WBC 3.4 - 10.8 x10E3/uL 8.3 7.3 7.8  Hemoglobin 11.1 - 15.9 g/dL 14.9 13.9 15.1(H)  Hematocrit 34.0 - 46.6 % 45.2 42.9 45.1  Platelets 150 - 450 x10E3/uL 294 266 311   Lab Results  Component Value Date   IRON 81 11/17/2019   TIBC 343 11/17/2019   FERRITIN 30 11/17/2019   Lab Results  Component Value Date   VITAMINB12 303 11/17/2019   Assessment/Plan:   1. Insulin resistance Deborah Nichols will continue to work on weight loss, exercise, and decreasing simple carbohydrates to help decrease the risk of diabetes. Deborah Nichols agreed  to follow-up with [facility] as directed to closely monitor her progress.  2. B12 deficiency The diagnosis was reviewed with the patient. Counseling provided today, see below. We will continue to monitor. Orders and follow up as documented in patient record.  Counseling . The body needs vitamin B12: to make red blood cells; to make DNA; and to help the nerves work properly so they can carry messages from the brain to the body.  . The main causes of vitamin B12 deficiency include dietary deficiency, digestive diseases, pernicious anemia, and having a surgery in which part of the stomach or small intestine is removed.  . Certain medicines can make it harder for the body to absorb vitamin B12. These medicines include: heartburn medications; some antibiotics; some medications used to treat diabetes, gout, and high cholesterol.  . In some cases, there are no symptoms of this condition. If the condition leads to anemia or nerve damage, various symptoms can occur, such as weakness or fatigue, shortness of breath, and numbness or tingling in your hands and feet.   . Treatment:  o May include taking vitamin B12 supplements.  o Avoid alcohol.  o Eat lots of healthy foods that contain vitamin B12: - Beef, pork, chicken, [place], and organ meats, such as liver.  - Seafood: This includes clams, rainbow trout, salmon, tuna, and haddock. Eggs.  - Cereal and dairy products that are fortified: This  means that vitamin B12 has been added to the food.   3. Low ferritin We reviewed iron in foods.  Orders and follow up as documented in patient record.  Counseling . Iron is essential for our bodies to make red blood cells.  Reasons that someone may be deficient include: an iron-deficient diet (more likely in those following vegan or vegetarian diets), women with heavy menses, patients with GI disorders or poor absorption, patients that have had bariatric surgery, frequent blood donors, patients with cancer, and patients with  heart disease.   Deborah Nichols foods include dark leafy greens, red and white meats, eggs, seafood, and beans.   . Certain foods and drinks prevent your body from absorbing iron properly. Avoid eating these foods in the same meal as iron-rich foods or with iron supplements. These foods include: coffee, black tea, and red wine; milk, dairy products, and foods that are high in calcium; beans and soybeans; whole grains.  . Constipation can be a side effect of iron supplementation. Increased water and fiber intake are helpful. Water goal: > 2 liters/day. Fiber goal: > 25 grams/day.  4. Class 2 severe obesity with serious comorbidity and body mass index (BMI) of 37.0 to 37.9 in adult, unspecified obesity type [facility]) Deborah Nichols is currently in the action stage of change. As such, her goal is to continue with weight loss efforts. She has agreed to the Category 1 Plan.   Exercise goals: For substantial health benefits, adults should do at least 150 minutes (2 hours and 30 minutes) a week of moderate-intensity, or 75 minutes (1 hour and 15 minutes) a week of vigorous-intensity aerobic physical activity, or an equivalent combination of moderate- and vigorous-intensity aerobic activity. Aerobic activity should be performed in episodes of at least 10 minutes, and preferably, it should be spread throughout the week.  Behavioral modification strategies: increasing lean protein intake, increasing vegetables, increasing water intake, increasing high fiber foods and meal planning and cooking strategies.  [name] has agreed to follow-up with our clinic in 2-3 weeks. She was informed of the importance of frequent follow-up visits to maximize her success with intensive lifestyle modifications for her multiple health conditions.   Objective:   Blood pressure 102/66, pulse 63, temperature 98.8 F (37.1 C), temperature source Oral, height 5\' 3"  (1.6 m), SpO2 99 %. Body mass index is 37.73 kg/m.  General: Cooperative, alert,  well developed, in no acute distress. HEENT: Conjunctivae and lids unremarkable. Cardiovascular: Regular rhythm.  Lungs: Normal work of breathing. Neurologic: No focal deficits.   Lab Results  Component Value Date   CREATININE 0.65 11/17/2019   BUN 9 11/17/2019   NA 137 11/17/2019   K 4.0 11/17/2019   CL 101 11/17/2019   CO2 24 11/17/2019   Lab Results  Component Value Date   ALT 24 11/17/2019   AST 21 11/17/2019   ALKPHOS 90 11/17/2019   BILITOT 0.4 11/17/2019   Lab Results  Component Value Date   HGBA1C 5.6 08/30/2019   HGBA1C 5.5 01/12/2017   Lab Results  Component Value Date   INSULIN 23.1 11/17/2019   Lab Results  Component Value Date   TSH 1.800 11/17/2019   Lab Results  Component Value Date   CHOL 166 08/30/2019   HDL 38.90 (L) 08/30/2019   LDLCALC 113 (H) 08/30/2019   TRIG 71.0 08/30/2019   CHOLHDL 4 08/30/2019   Lab Results  Component Value Date   WBC 8.3 11/17/2019   HGB 14.9 11/17/2019   HCT 45.2 11/17/2019  MCV 89 11/17/2019   PLT 294 11/17/2019   Lab Results  Component Value Date   IRON 81 11/17/2019   TIBC 343 11/17/2019   FERRITIN 30 11/17/2019   Attestation Statements:   Reviewed by clinician on day of visit: allergies, medications, problem list, medical history, surgical history, family history, social history, and previous encounter notes.  Time spent on visit including pre-visit chart review and post-visit care and charting was 25 minutes.   I, Water quality scientist, CMA, am acting as transcriptionist for Briscoe Deutscher, DO  I have reviewed the above documentation for accuracy and completeness, and I agree with the above. Briscoe Deutscher, DO

## 2019-12-28 ENCOUNTER — Other Ambulatory Visit: Payer: Self-pay

## 2019-12-28 ENCOUNTER — Ambulatory Visit (INDEPENDENT_AMBULATORY_CARE_PROVIDER_SITE_OTHER): Admitting: Family Medicine

## 2019-12-28 VITALS — BP 99/67 | HR 73 | Temp 98.0°F | Ht 63.0 in | Wt 211.0 lb

## 2019-12-28 DIAGNOSIS — Z6837 Body mass index (BMI) 37.0-37.9, adult: Secondary | ICD-10-CM

## 2019-12-28 DIAGNOSIS — Z9189 Other specified personal risk factors, not elsewhere classified: Secondary | ICD-10-CM | POA: Diagnosis not present

## 2019-12-28 DIAGNOSIS — E66812 Obesity, class 2: Secondary | ICD-10-CM

## 2019-12-28 DIAGNOSIS — E88819 Insulin resistance, unspecified: Secondary | ICD-10-CM

## 2019-12-28 DIAGNOSIS — E559 Vitamin D deficiency, unspecified: Secondary | ICD-10-CM | POA: Diagnosis not present

## 2019-12-28 DIAGNOSIS — E8881 Metabolic syndrome: Secondary | ICD-10-CM | POA: Diagnosis not present

## 2019-12-28 MED ORDER — VITAMIN D (ERGOCALCIFEROL) 1.25 MG (50000 UNIT) PO CAPS: 50000.0000 [IU] | ORAL_CAPSULE | ORAL | 0 refills | Status: AC

## 2019-12-29 NOTE — Progress Notes (Signed)
Chief Complaint:   OBESITY Deborah Nichols is here to discuss her progress with her obesity treatment plan along with follow-up of her obesity related diagnoses. Deborah Nichols is on the Category 1 Plan and states she is following her eating plan approximately 0% of the time.    Deborah Nichols states she is exercising for 0 minutes 0 times per week.   Today's visit was #: 3 Starting weight:  213 lbs  Starting date: 11/17/2019 Today's weight: 211 lbs Today's date: 12/28/2019 Total lbs lost to date: 2 lbs Total lbs lost since last in-office visit: 0   Interim History: Deborah Nichols had family come to visit and ate off plan for about a week.   It has been 3 weeks since her last office visit with Dr. Juleen China on 12/01/2019.  She says she is trying to get back on the plan, but she has modified the plan with Dr Alcario Drought advice and is trying to eat less calories per day now.  Despite what prior OV notes state, she is using MFP and "going by what that tells her to eat".    She says she is "just paying attention to calories" but does not know how many calories Dr Juleen China would like her to eat per day or how many grams of protein per day she is suppose to be eating.    Subjective:    1. Insulin resistance Deborah Nichols has a diagnosis of insulin resistance based on her elevated fasting insulin level >5. She continues to work on diet and exercise to decrease her risk of diabetes.   Lab Results  Component Value Date   INSULIN 23.1 11/17/2019   Lab Results  Component Value Date   HGBA1C 5.6 08/30/2019    2. Vitamin D deficiency Deborah Nichols Vitamin D level was 9.39 on 08/30/2019. She is currently taking prescription vitamin D 50,000 IU each week. She denies nausea, vomiting or muscle weakness.   3. At risk for dehydration Deborah Nichols is at risk for dehydration due to inadequate water intake.   Assessment/Plan:   1. Insulin resistance Deborah Nichols will continue to work on weight loss, exercise, and decreasing simple  carbohydrates to help decrease the risk of diabetes. Deborah Nichols agreed to follow-up with [place] as directed to closely monitor her progress.   2. Vitamin D deficiency Low Vitamin D level contributes to fatigue and are associated with obesity, breast, and colon cancer. She agrees to continue to take prescription Vitamin D @50 ,000 IU every week and will follow-up for routine testing of Vitamin D, at least 2-3 times per year to avoid over-replacement. - REFILL Vitamin D, Ergocalciferol, (DRISDOL) 1.25 MG (50000 UNIT) CAPS capsule; Take 1 capsule (50,000 Units total) by mouth every 7 (seven) days.  Dispense: 4 capsule; Refill: 0   3. At risk for dehydration Deborah Nichols was given approximately 15 minutes dehydration prevention counseling today. Myalee is at risk for dehydration due to weight loss and current medication(s). She was encouraged to hydrate and monitor fluid status to avoid dehydration as well as weight loss plateaus.    4. Class 2 severe obesity with serious comorbidity and body mass index (BMI) of 37.0 to 37.9 in adult, unspecified obesity type Deborah Nichols) Deborah Nichols is currently in the action stage of change. As such, her goal is to continue with weight loss efforts. She has agreed to the Category 1 Plan (as previous notes state she is to be on).   I recommended to pt today to follow this CAT 1 plan consistently until  seen by Dr Juleen China next office visit.  Exercise goals: As is.  Behavioral modification strategies: increasing lean protein intake, increasing water intake, meal planning and cooking strategies and keeping healthy foods in the home.   Deborah Nichols has agreed to follow-up with our clinic in 2 weeks with Dr. Juleen China.  She was informed of the importance of frequent follow-up visits to maximize her success with intensive lifestyle modifications for her multiple health conditions.    Objective:   Blood pressure 99/67, pulse 73, temperature 98 F (36.7 C), height 5\' 3"  (1.6 m), weight (!) 211 lb (95.7  kg), SpO2 98 %. Body mass index is 37.38 kg/m.  General: Cooperative, alert, well developed, in no acute distress. HEENT: Conjunctivae and lids unremarkable. Cardiovascular: Regular rhythm.  Lungs: Normal work of breathing. Neurologic: No focal deficits.   Lab Results  Component Value Date   CREATININE 0.65 11/17/2019   BUN 9 11/17/2019   NA 137 11/17/2019   K 4.0 11/17/2019   CL 101 11/17/2019   CO2 24 11/17/2019   Lab Results  Component Value Date   ALT 24 11/17/2019   AST 21 11/17/2019   ALKPHOS 90 11/17/2019   BILITOT 0.4 11/17/2019   Lab Results  Component Value Date   HGBA1C 5.6 08/30/2019   HGBA1C 5.5 01/12/2017   Lab Results  Component Value Date   INSULIN 23.1 11/17/2019   Lab Results  Component Value Date   TSH 1.800 11/17/2019   Lab Results  Component Value Date   CHOL 166 08/30/2019   HDL 38.90 (L) 08/30/2019   LDLCALC 113 (H) 08/30/2019   TRIG 71.0 08/30/2019   CHOLHDL 4 08/30/2019   Lab Results  Component Value Date   WBC 8.3 11/17/2019   HGB 14.9 11/17/2019   HCT 45.2 11/17/2019   MCV 89 11/17/2019   PLT 294 11/17/2019   Lab Results  Component Value Date   IRON 81 11/17/2019   TIBC 343 11/17/2019   FERRITIN 30 11/17/2019   Attestation Statements:   Reviewed by clinician on day of visit: allergies, medications, problem list, medical history, surgical history, family history, social history, and previous encounter notes.  I, Water quality scientist, CMA, am acting as Location manager for [employer], DO.  I have reviewed the above documentation for accuracy and completeness, and I agree with the above. Mellody Dance, DO

## 2020-01-17 ENCOUNTER — Telehealth: Payer: Self-pay | Admitting: *Deleted

## 2020-01-17 NOTE — Telephone Encounter (Signed)
Patient left a voicemail stating that she is having a lot of foot pain and difficulty walking. Patient wants to know if her vitamins may be causing this. Called patient back and left her a message to call the office back.

## 2020-01-18 ENCOUNTER — Ambulatory Visit (INDEPENDENT_AMBULATORY_CARE_PROVIDER_SITE_OTHER): Admitting: Family Medicine

## 2020-01-18 NOTE — Telephone Encounter (Signed)
Based off of chart review it appears that Deborah Nichols is prescribing the vitamin D 50,000 unit capsules.   I recommend she stop taking the vitamin D if her symptoms occur when taking her weekly dose.  For the rest of her symptoms, she needs an office visit with me. Please schedule for next week. I have several follow up questions. She may need to see neuro but I'd like to speak with her first.

## 2020-01-18 NOTE — Telephone Encounter (Addendum)
I spoke with pt; Pt has no covid symptoms, no travel and no known exposure to + covid. Pt has not had covid vaccine. Pt said she thinks the foot and knee pain may be related to sciatica problem; pt said that she needs to get paperwork filled out by sciatica doctor and pt will see sciatica doctor about knee and foot pain. Pt wants to know from Allie Bossier NP if she thinks that the sharp consistent pain in rt foot could be related to starting the vitamin D.Pt said she read something on goggle that vit D could cause pain in knee and foot.  Pt has problem with weakness in rt hand when tries to pick up a pot off stove or anything with weight as well. Pt has spoken with Gentry Fitz Np previously about pt wanting to see a neurologist and Anda Kraft did not think necessary at that time. Now pt wants to know if Allie Bossier NP thinks pt should see a neurologist with weakness in hand and pain in knee and foot if Anda Kraft does not think the pain and weakness could be related to Vitamin D.pt realizes she is seeing the spine doctor about pain being related to sciatica but wants Anda Kraft as PCP to be aware of what is going on and get her advice as well. Anda Kraft does not have available appt in office this week. Pt request cb after reviewed by Gentry Fitz NP. Pt request cb today due to pt is supposed to take Vit D today.

## 2020-01-18 NOTE — Telephone Encounter (Signed)
[place] Night - Client TELEPHONE ADVICE RECORD [facility] Patient Name: Deborah Nichols Gender: Female DOB: February 11, 1986 Age: 34 Y 33 M 23 D Return Phone Number: [phone] (Primary) Address: City/State/Zip: [place] [address] Client [facility] Night - Client Client Site [place] Physician [facility] - PHYSICIAN, NOT LISTED- MD Contact Type Call Who Is Calling Patient / Member / Family / Caregiver Call Type Triage / Clinical Relationship To Patient Self Return Phone Number [phone] (Primary) Chief Complaint Foot Pain Reason for Call Symptomatic / Request for [place] states she is returning a call from a nurse. She is having foot pain and difficulty walking. Translation No Nurse Assessment Nurse: Zara Council, RN, Joelene Millin Date/Time (Eastern Time): 01/17/2020 5:34:11 PM Confirm and document reason for call. If symptomatic, describe symptoms. ---Caller states she is returning a call from a nurse at the office. She called the office early today about getting a referral to a neurologist and missed the nurse's call back. She is having R foot pain causing difficulty walking onset 4 weeks ago. Denies injury. Has the patient had close contact with a person known or suspected to have the novel coronavirus illness OR traveled / lives in area with major community spread (including international travel) in the last 14 days from the onset of symptoms? * If Asymptomatic, screen for exposure and travel within the last 14 days. ---No Does the patient have any new or worsening symptoms? ---Yes Will a triage be completed? ---Yes Related visit to physician within the last 2 weeks? ---No Does the PT have any chronic conditions? (i.e. diabetes, asthma, this includes High risk factors for pregnancy, etc.) ---Yes List chronic conditions. ---vitamin D deficiency, Hx sciatic pain Is the  patient pregnant or possibly pregnant? (Ask all females between the ages of 39-55) ---No Is this a behavioral health or substance abuse call? ---No Guidelines Guideline Title Affirmed Question Affirmed Notes Nurse Date/Time (Eastern Time) Foot Pain [1] MODERATE pain (e.g., interferes with normal activities, Cabe, RN, Joelene Millin 01/17/2020 5:40:56 PM PLEASE NOTE: All timestamps contained within this report are represented as [place] Standard Time. CONFIDENTIALTY NOTICE: This fax transmission is intended only for the addressee. It contains information that is legally privileged, confidential or otherwise protected from use or disclosure. If you are not the intended recipient, you are strictly prohibited from reviewing, disclosing, copying using or disseminating any of this information or taking any action in reliance on or regarding this information. If you have received this fax in error, please notify us immediately by telephone so that we can arrange for its return to [place]. Phone: [phone], Toll-Free: [phone], Fax: [phone] Page: 2 of 2 Call Id: [id number] Guidelines Guideline Title Affirmed Question Affirmed Notes Nurse Date/Time Eilene Ghazi Time) limping) AND [2] present > 3 days Knee Pain [1] MODERATE pain (e.g., interferes with normal activities, limping) AND [2] present > 3 days Zara Council, RNJoelene Millin 01/17/2020 5:44:40 PM Disp. Time Eilene Ghazi Time) Disposition Final User 01/17/2020 5:43:37 PM SEE PCP WITHIN 3 DAYS Cabe, RN, Joelene Millin 01/17/2020 5:47:17 PM SEE PCP WITHIN 3 DAYS Yes Cabe, RN, Deborah Nichols Disagree/Comply Comply Caller Understands Yes PreDisposition Did not know what to do Care Advice Given Per Guideline SEE PCP WITHIN 3 DAYS: * You need to be seen within 2 or 3 days. PAIN MEDICINES: * For pain relief, you can take either acetaminophen, ibuprofen, or naproxen. CALL BACK IF: * Fever occurs * You become worse. CARE ADVICE given per  Foot Pain (Adult)  guideline. SEE PCP WITHIN 3 DAYS: * You need to be seen within 2 or 3 days. REST YOUR KNEE FOR THE NEXT COUPLE DAYS: * Avoid activities that worsen your pain. LOCAL HEAT: * Apply a warm washcloth or heating pad for 10 minutes three times daily. * This will help to increase circulation and improve healing. PAIN MEDICINES: * For pain relief, you can take either acetaminophen, ibuprofen, or naproxen. CALL BACK IF: * Fever or severe knee pain occurs * Redness or severe swelling occurs * You become worse. CARE ADVICE given per Knee Pain (Adult) guideline. Comments User: Deborah Dyer, RN Date/Time Eilene Ghazi Time): 01/17/2020 5:44:27 PM c/o R knee pain onset a year ago. Denies injury. Referrals REFERRED TO PCP OFFICE

## 2020-01-19 NOTE — Telephone Encounter (Signed)
Message left for patient to return my call.  

## 2020-01-24 NOTE — Telephone Encounter (Signed)
Please close and sign encounter when completed. 

## 2020-01-25 NOTE — Telephone Encounter (Signed)
Message left for patient to return my call on 01/23/2020 and 01/25/2020

## 2020-11-10 IMAGING — CR DG CHEST 2V
2 series · 2 of 2 positions shown · non-contrast
Comparison: 02/23/2015

CLINICAL DATA: Chest pain

EXAM:
CHEST - 2 VIEW

[chest pa]
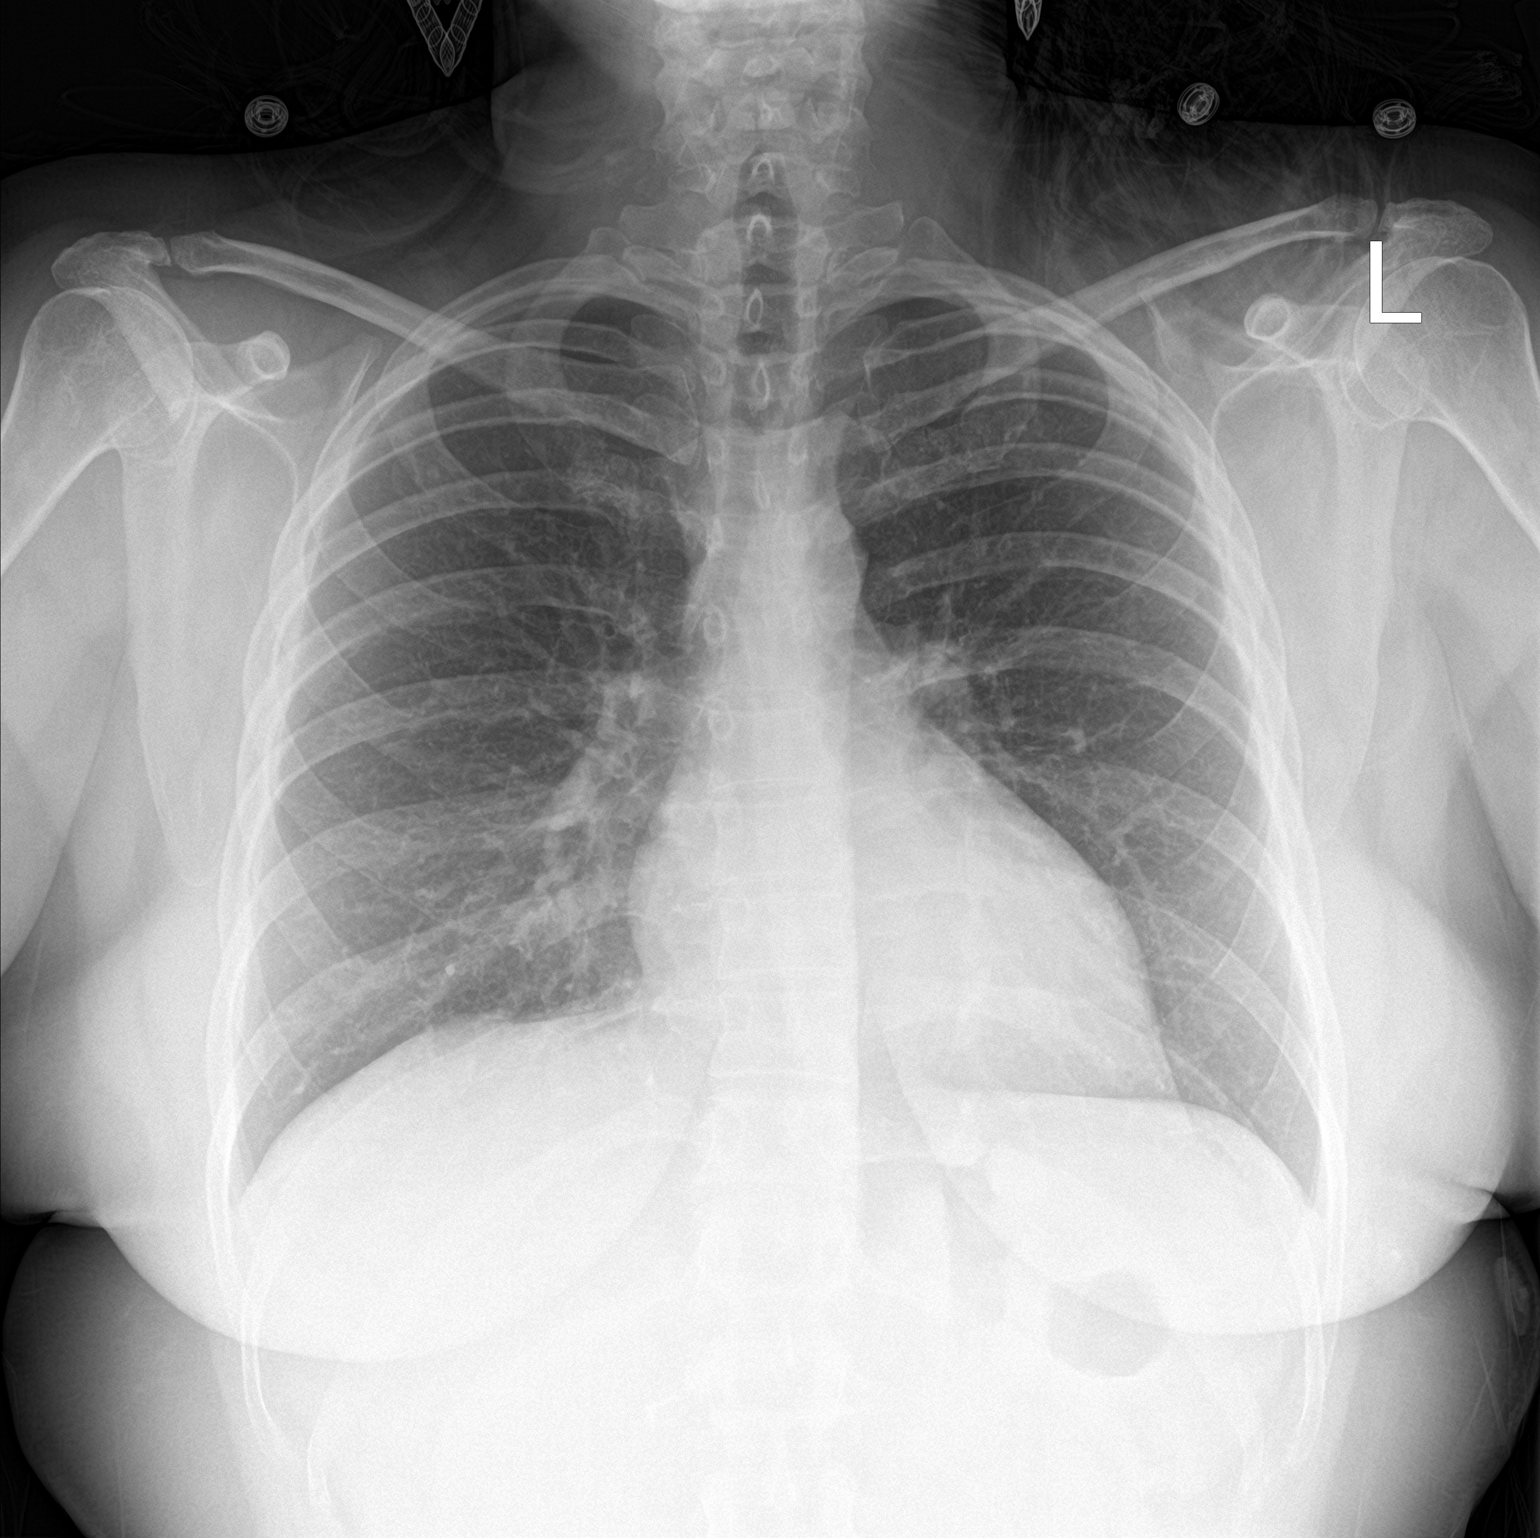

[chest lat]
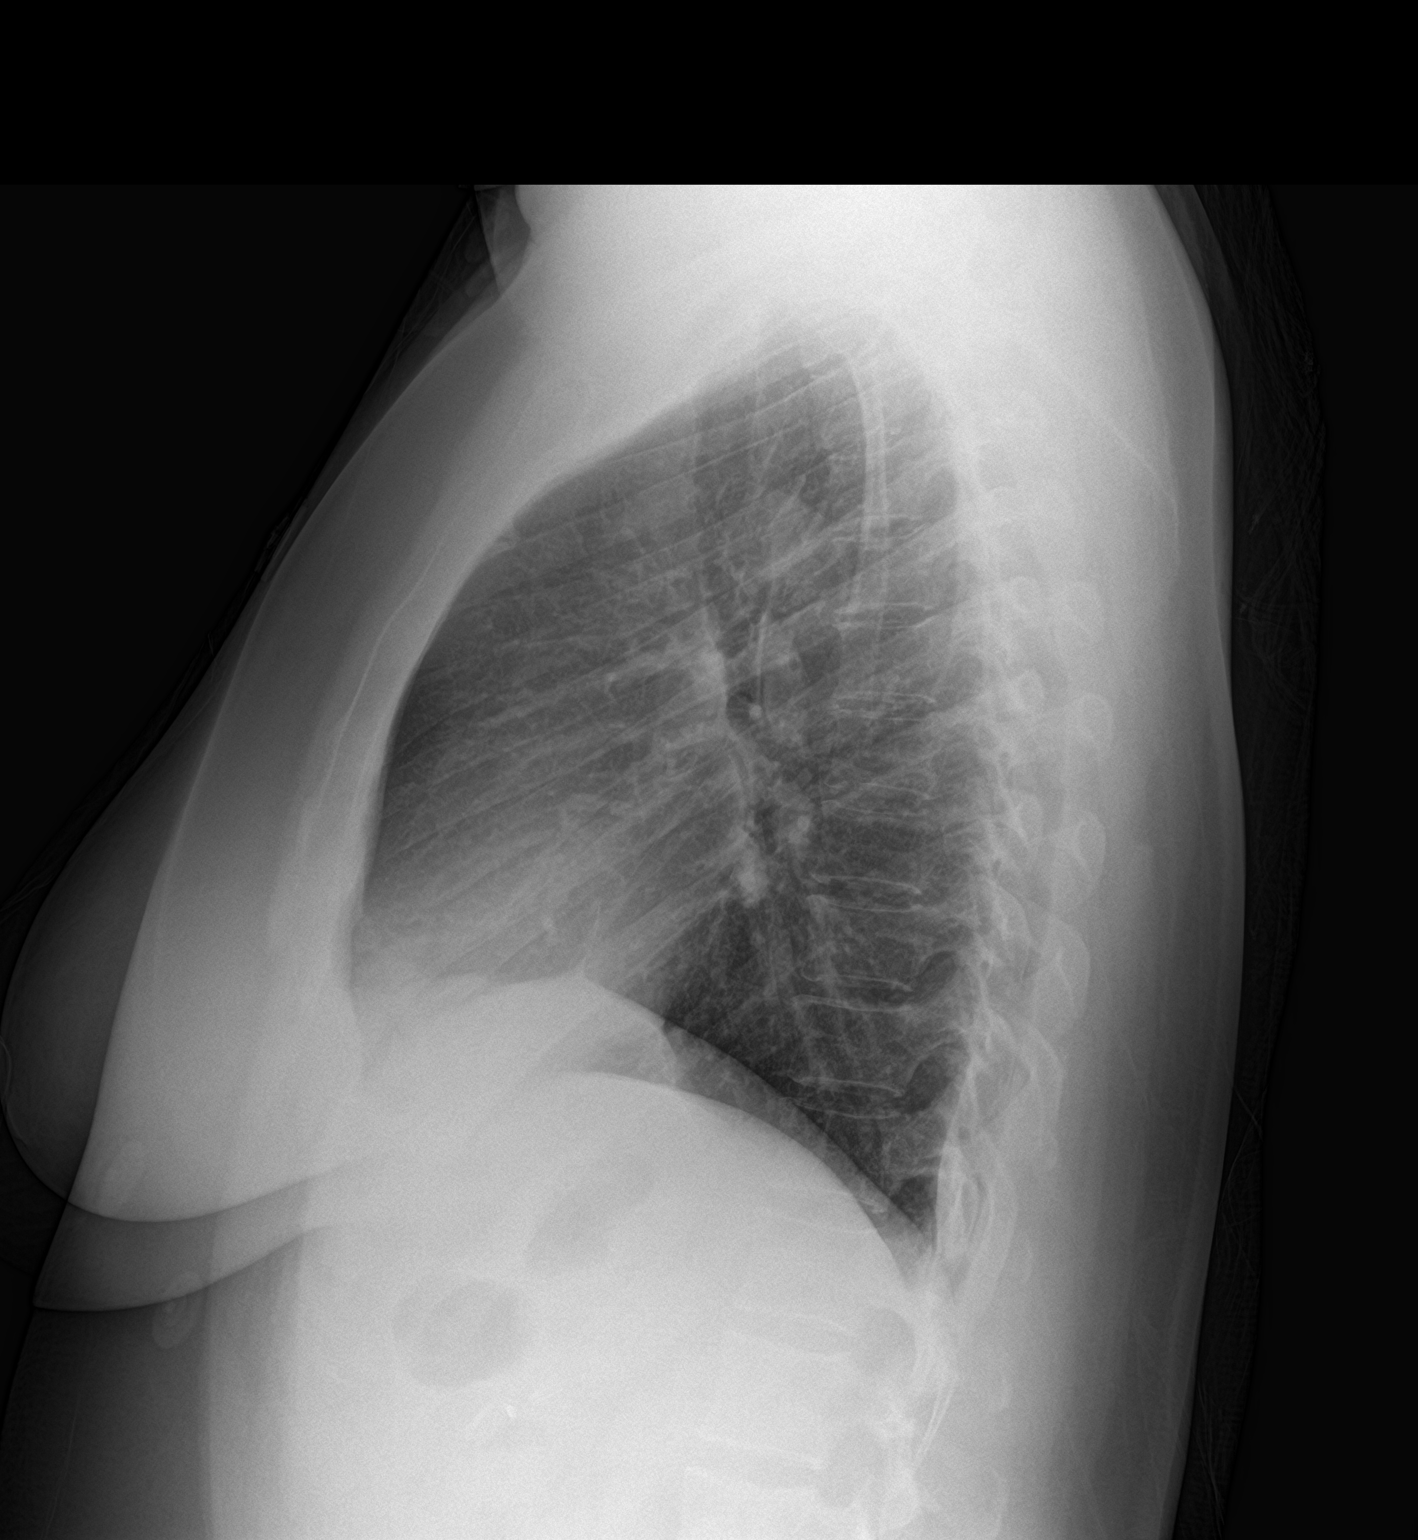

[2 of 2 positions shown; findings below may reference images not displayed]

FINDINGS: The heart size and mediastinal contours are within normal limits.
Both lungs are clear. The visualized skeletal structures are
unremarkable.
IMPRESSION: No acute abnormality of the lungs.

## 2022-01-08 ENCOUNTER — Encounter (INDEPENDENT_AMBULATORY_CARE_PROVIDER_SITE_OTHER): Payer: Self-pay

## 2022-08-13 ENCOUNTER — Other Ambulatory Visit: Payer: Self-pay | Admitting: Physician Assistant

## 2022-08-13 DIAGNOSIS — R413 Other amnesia: Secondary | ICD-10-CM

## 2022-08-13 DIAGNOSIS — H538 Other visual disturbances: Secondary | ICD-10-CM

## 2022-08-13 DIAGNOSIS — R202 Paresthesia of skin: Secondary | ICD-10-CM

## 2022-08-13 DIAGNOSIS — R531 Weakness: Secondary | ICD-10-CM

## 2022-08-13 DIAGNOSIS — R479 Unspecified speech disturbances: Secondary | ICD-10-CM

## 2022-09-04 ENCOUNTER — Inpatient Hospital Stay: Admission: RE | Admit: 2022-09-04 | Payer: Self-pay | Source: Ambulatory Visit

## 2022-09-04 ENCOUNTER — Other Ambulatory Visit: Payer: Self-pay

## 2023-11-12 ENCOUNTER — Ambulatory Visit: Admitting: Dietician
